# Patient Record
Sex: Female | Born: 1935 | Race: White | Hispanic: No | Smoking: Never smoker
Health system: Southern US, Community
[De-identification: ages and names within clinical notes are randomized; demographics above are authoritative.]

## PROBLEM LIST (undated history)

## (undated) DIAGNOSIS — E785 Hyperlipidemia, unspecified: Secondary | ICD-10-CM

## (undated) DIAGNOSIS — M109 Gout, unspecified: Secondary | ICD-10-CM

## (undated) DIAGNOSIS — N289 Disorder of kidney and ureter, unspecified: Secondary | ICD-10-CM

## (undated) DIAGNOSIS — J449 Chronic obstructive pulmonary disease, unspecified: Secondary | ICD-10-CM

## (undated) DIAGNOSIS — I639 Cerebral infarction, unspecified: Secondary | ICD-10-CM

## (undated) DIAGNOSIS — E119 Type 2 diabetes mellitus without complications: Secondary | ICD-10-CM

## (undated) DIAGNOSIS — I10 Essential (primary) hypertension: Secondary | ICD-10-CM

---

## 2017-12-01 ENCOUNTER — Emergency Department (HOSPITAL_COMMUNITY): Payer: Medicare Other

## 2017-12-01 ENCOUNTER — Emergency Department (HOSPITAL_COMMUNITY)
Admission: EM | Admit: 2017-12-01 | Discharge: 2017-12-01 | Disposition: A | Discharge status: Home / Self Care | Payer: Medicare Other | Attending: Emergency Medicine | Admitting: Emergency Medicine

## 2017-12-01 ENCOUNTER — Other Ambulatory Visit: Payer: Self-pay

## 2017-12-01 ENCOUNTER — Encounter (HOSPITAL_COMMUNITY): Payer: Self-pay | Admitting: *Deleted

## 2017-12-01 DIAGNOSIS — E119 Type 2 diabetes mellitus without complications: Secondary | ICD-10-CM | POA: Insufficient documentation

## 2017-12-01 DIAGNOSIS — J449 Chronic obstructive pulmonary disease, unspecified: Secondary | ICD-10-CM | POA: Diagnosis not present

## 2017-12-01 DIAGNOSIS — Y999 Unspecified external cause status: Secondary | ICD-10-CM | POA: Diagnosis not present

## 2017-12-01 DIAGNOSIS — Y939 Activity, unspecified: Secondary | ICD-10-CM | POA: Insufficient documentation

## 2017-12-01 DIAGNOSIS — Z7982 Long term (current) use of aspirin: Secondary | ICD-10-CM | POA: Diagnosis not present

## 2017-12-01 DIAGNOSIS — Z8673 Personal history of transient ischemic attack (TIA), and cerebral infarction without residual deficits: Secondary | ICD-10-CM | POA: Insufficient documentation

## 2017-12-01 DIAGNOSIS — S0083XA Contusion of other part of head, initial encounter: Secondary | ICD-10-CM

## 2017-12-01 DIAGNOSIS — W19XXXA Unspecified fall, initial encounter: Secondary | ICD-10-CM

## 2017-12-01 DIAGNOSIS — Z7984 Long term (current) use of oral hypoglycemic drugs: Secondary | ICD-10-CM | POA: Diagnosis not present

## 2017-12-01 DIAGNOSIS — T1490XA Injury, unspecified, initial encounter: Secondary | ICD-10-CM

## 2017-12-01 DIAGNOSIS — Y929 Unspecified place or not applicable: Secondary | ICD-10-CM | POA: Diagnosis not present

## 2017-12-01 DIAGNOSIS — S0990XA Unspecified injury of head, initial encounter: Secondary | ICD-10-CM | POA: Diagnosis present

## 2017-12-01 DIAGNOSIS — E785 Hyperlipidemia, unspecified: Secondary | ICD-10-CM | POA: Insufficient documentation

## 2017-12-01 DIAGNOSIS — Z79899 Other long term (current) drug therapy: Secondary | ICD-10-CM | POA: Diagnosis not present

## 2017-12-01 DIAGNOSIS — W07XXXA Fall from chair, initial encounter: Secondary | ICD-10-CM | POA: Diagnosis not present

## 2017-12-01 HISTORY — DX: Type 2 diabetes mellitus without complications: E11.9

## 2017-12-01 HISTORY — DX: Hyperlipidemia, unspecified: E78.5

## 2017-12-01 HISTORY — DX: Chronic obstructive pulmonary disease, unspecified: J44.9

## 2017-12-01 HISTORY — DX: Gout, unspecified: M10.9

## 2017-12-01 HISTORY — DX: Cerebral infarction, unspecified: I63.9

## 2017-12-01 HISTORY — DX: Essential (primary) hypertension: I10

## 2017-12-01 HISTORY — DX: Disorder of kidney and ureter, unspecified: N28.9

## 2017-12-01 MED ORDER — BACITRACIN ZINC 500 UNIT/GM EX OINT: 1.0000 "application " | TOPICAL_OINTMENT | Freq: Two times a day (BID) | CUTANEOUS | 0 refills | Status: DC

## 2017-12-01 MED ORDER — BACITRACIN ZINC 500 UNIT/GM EX OINT
1.0000 "application " | TOPICAL_OINTMENT | Freq: Two times a day (BID) | CUTANEOUS | Status: DC
  Administered 2017-12-01: 1 via TOPICAL
  Filled 2017-12-01: qty 0.9

## 2017-12-01 NOTE — ED Provider Notes (Signed)
[facility] EMERGENCY DEPARTMENT Provider Note   CSN: [id number] Arrival date & time: 12/01/17  1738     History   Chief Complaint Chief Complaint  Patient presents with  . Fall    HPI Judith Turner is a 84 y.o. female.  HPI  The patient is a 84 year old female, she has a known history of COPD, diabetes, stroke and is on chronic aspirin therapy.  She had a fall which was mechanical as she lost her balance trying to sit down in a chair falling forward and striking her face against the ground.  This happened a short time ago just prior to arrival, the symptoms are persistent, gradually improving and at this time she has no pain though she does report some swelling of her forehead, nose and some bruising under her bilateral eyes.  She also has some pain in her right small finger.  She did not attempt to get up off the ground, she did not lose consciousness, she has not had any vomiting or seizure activity.  Past Medical History:  Diagnosis Date  . COPD (chronic obstructive pulmonary disease) (HCC)   . Diabetes mellitus without complication (HCC)   . Gout   . Hyperlipidemia   . Hypertension   . Renal disorder    Stage 1 renal failure  . Stroke [facility])    TIA no residual  deficits    Patient Active Problem List   Diagnosis Date Noted  . Hyperlipidemia     History reviewed. No pertinent surgical history.  OB History    No data available       Home Medications    Prior to Admission medications   Medication Sig Start Date End Date Taking? Authorizing Provider  acetaminophen (TYLENOL) 325 MG tablet Take 650 mg by mouth every 4 (four) hours as needed.   Yes [provider]  albuterol (PROVENTIL HFA;VENTOLIN HFA) 108 (90 Base) MCG/ACT inhaler Inhale into the lungs 4 (four) times daily as needed for wheezing or shortness of breath.   Yes [provider]  allopurinol (ZYLOPRIM) 100 MG tablet Take 100 mg by mouth daily.   Yes [provider]  amLODipine (NORVASC) 5 MG tablet Take 5 mg by mouth daily.   Yes [provider]  aspirin 325 MG tablet Take 325 mg by mouth daily.   Yes [provider]  cholecalciferol (VITAMIN D) 1000 units tablet Take 1,000 Units by mouth daily.   Yes [provider]  cyanocobalamin 500 MCG tablet Take 500 mcg by mouth daily.   Yes [provider]  ferrous sulfate 325 (65 FE) MG tablet Take 325 mg by mouth daily with breakfast.   Yes [provider]  magnesium oxide (MAG-OX) 400 MG tablet Take 400 mg by mouth 2 (two) times daily.   Yes [provider]  metFORMIN (GLUCOPHAGE) 500 MG tablet Take 500 mg by mouth 3 (three) times daily.   Yes [provider]  metoprolol tartrate (LOPRESSOR) 25 MG tablet Take 25 mg by mouth 2 (two) times daily.   Yes [provider]  Probiotic Product (RISA-BID PROBIOTIC) TABS Take 1 tablet by mouth daily.   Yes [provider]  senna (SENOKOT) 8.6 MG TABS tablet Take 1 tablet by mouth daily as needed for mild constipation.   Yes [provider]  tolnaftate (TINACTIN) 1 % spray Apply 1 application topically 2 (two) times daily.   Yes [provider]  bacitracin ointment Apply 1 application topically 2 (  two) times daily. 12/01/17   Eber HongMiller, Laray Rivkin, MD    Family History History reviewed. No pertinent family history.  Social History Social History   Tobacco Use  . Smoking status: Never Smoker  . Smokeless tobacco: Current User  Substance Use Topics  . Alcohol use: No    Frequency: Never  . Drug use: No     Allergies   Patient has no known allergies.   Review of Systems Review of Systems  All other systems reviewed and are negative.    Physical Exam Updated Vital Signs BP (!) 145/73   Pulse 83   Temp 97.8 F (36.6 C) (Oral)   Resp 17   Ht 5\' 1"  (1.549 m)   Wt 73.9 kg (163 lb)   SpO2 97%   BMI 30.80 kg/m   Physical Exam  Constitutional: She  appears well-developed and well-nourished. No distress.  HENT:  Head: Normocephalic.  Mouth/Throat: Oropharynx is clear and moist. No oropharyngeal exudate.  Bilateral raccoon eyes present, tenderness over the nasal bridge, abrasion over the nasal bridge as well.  Hematoma over the forehead.  Eyes: Conjunctivae and EOM are normal. Pupils are equal, round, and reactive to light. Right eye exhibits no discharge. Left eye exhibits no discharge. No scleral icterus.  Neck: Normal range of motion. Neck supple. No JVD present. No thyromegaly present.  Cardiovascular: Normal rate, regular rhythm, normal heart sounds and intact distal pulses. Exam reveals no gallop and no friction rub.  No murmur heard. Pulmonary/Chest: Effort normal and breath sounds normal. No respiratory distress. She has no wheezes. She has no rales.  Abdominal: Soft. Bowel sounds are normal. She exhibits no distension and no mass. There is no tenderness.  Musculoskeletal: Normal range of motion. She exhibits tenderness. She exhibits no edema.  Mild tenderness over the right small finger at the interphalangeal distal joint, no other tenderness deformity or significant abnormality.  Joints are all supple, compartments are soft,  Lymphadenopathy:    She has no cervical adenopathy.  Neurological: She is alert. Coordination normal.  Mental status is awake alert, able to follow commands without any difficulties.  Skin: Skin is warm and dry. No rash noted. No erythema.  Psychiatric: She has a normal mood and affect. Her behavior is normal.  Nursing note and vitals reviewed.    ED Treatments / Results  Labs (all labs ordered are listed, but only abnormal results are displayed) Labs Reviewed - No data to display  EKG  EKG Interpretation  Date/Time:  Thursday December 01 2017 17:50:39 EST Ventricular Rate:  80 PR Interval:    QRS Duration: 92 QT Interval:  403 QTC Calculation: 465 R Axis:   24 Text Interpretation:  Sinus  rhythm Anterior infarct, old Minimal ST elevation, inferior leads q Waves in oleads V1-V3 No old tracing to compare Confirmed by Eber HongMiller, Lakhia Gengler ([id number]) on 12/01/2017 5:52:54 PM       Radiology Ct Head Wo Contrast  Result Date: 12/01/2017 CLINICAL DATA:  Fall.  Facial trauma. EXAM: CT HEAD WITHOUT CONTRAST CT MAXILLOFACIAL WITHOUT CONTRAST CT CERVICAL SPINE WITHOUT CONTRAST TECHNIQUE: Multidetector CT imaging of the head, cervical spine, and maxillofacial structures were performed using the standard protocol without intravenous contrast. Multiplanar CT image reconstructions of the cervical spine and maxillofacial structures were also generated. COMPARISON:  None. FINDINGS: CT HEAD FINDINGS Brain: No evidence of acute infarction, hemorrhage, hydrocephalus, extra-axial collection or mass lesion/mass effect. Mild atrophy. Moderate chronic microvascular ischemic changes. Vascular: Intracranial atherosclerosis.  No hyperdense vessel. Skull: Negative  for fracture or focal lesion. Other: Small frontal scalp hematoma. CT MAXILLOFACIAL FINDINGS Osseous: No fracture or mandibular dislocation. No destructive process. Orbits: Negative. No traumatic or inflammatory finding. Sinuses: Clear. Soft tissues: Small hematoma over the nasal bridge. CT CERVICAL SPINE FINDINGS Alignment: Focal reversal of the normal cervical lordosis at C3-C4. No traumatic malalignment. Skull base and vertebrae: No acute fracture. No primary bone lesion or focal pathologic process. Soft tissues and spinal canal: No prevertebral fluid or swelling. No visible canal hematoma. Disc levels: Severe degenerative disc disease and uncovertebral hypertrophy from C3-C4 through C5-C6. Upper chest: Negative. Other: Atherosclerotic calcifications of the bilateral carotid bifurcations. IMPRESSION: 1. No acute intracranial abnormality. Small frontal scalp hematoma. 2. No acute facial fracture.  Small hematoma over the nasal bridge. 3. No acute cervical spine  fracture. Severe degenerative changes from C3-C4 through C5-C6. Electronically Signed   By: Obie Dredge M.D.   On: 12/01/2017 18:43   Ct Cervical Spine Wo Contrast  Result Date: 12/01/2017 CLINICAL DATA:  Fall.  Facial trauma. EXAM: CT HEAD WITHOUT CONTRAST CT MAXILLOFACIAL WITHOUT CONTRAST CT CERVICAL SPINE WITHOUT CONTRAST TECHNIQUE: Multidetector CT imaging of the head, cervical spine, and maxillofacial structures were performed using the standard protocol without intravenous contrast. Multiplanar CT image reconstructions of the cervical spine and maxillofacial structures were also generated. COMPARISON:  None. FINDINGS: CT HEAD FINDINGS Brain: No evidence of acute infarction, hemorrhage, hydrocephalus, extra-axial collection or mass lesion/mass effect. Mild atrophy. Moderate chronic microvascular ischemic changes. Vascular: Intracranial atherosclerosis.  No hyperdense vessel. Skull: Negative for fracture or focal lesion. Other: Small frontal scalp hematoma. CT MAXILLOFACIAL FINDINGS Osseous: No fracture or mandibular dislocation. No destructive process. Orbits: Negative. No traumatic or inflammatory finding. Sinuses: Clear. Soft tissues: Small hematoma over the nasal bridge. CT CERVICAL SPINE FINDINGS Alignment: Focal reversal of the normal cervical lordosis at C3-C4. No traumatic malalignment. Skull base and vertebrae: No acute fracture. No primary bone lesion or focal pathologic process. Soft tissues and spinal canal: No prevertebral fluid or swelling. No visible canal hematoma. Disc levels: Severe degenerative disc disease and uncovertebral hypertrophy from C3-C4 through C5-C6. Upper chest: Negative. Other: Atherosclerotic calcifications of the bilateral carotid bifurcations. IMPRESSION: 1. No acute intracranial abnormality. Small frontal scalp hematoma. 2. No acute facial fracture.  Small hematoma over the nasal bridge. 3. No acute cervical spine fracture. Severe degenerative changes from C3-C4  through C5-C6. Electronically Signed   By: Obie Dredge M.D.   On: 12/01/2017 18:43   Dg Hand Complete Right  Result Date: 12/01/2017 CLINICAL DATA:  Right hand pain and swelling after fall. EXAM: RIGHT HAND - COMPLETE 3+ VIEW COMPARISON:  None. FINDINGS: No acute fracture or malalignment. Joint spaces are preserved. Osteopenia. Vascular calcifications. Soft tissues are otherwise unremarkable. IMPRESSION: No acute osseous abnormality. Electronically Signed   By: Obie Dredge M.D.   On: 12/01/2017 19:05   Ct Maxillofacial Wo Contrast  Result Date: 12/01/2017 CLINICAL DATA:  Fall.  Facial trauma. EXAM: CT HEAD WITHOUT CONTRAST CT MAXILLOFACIAL WITHOUT CONTRAST CT CERVICAL SPINE WITHOUT CONTRAST TECHNIQUE: Multidetector CT imaging of the head, cervical spine, and maxillofacial structures were performed using the standard protocol without intravenous contrast. Multiplanar CT image reconstructions of the cervical spine and maxillofacial structures were also generated. COMPARISON:  None. FINDINGS: CT HEAD FINDINGS Brain: No evidence of acute infarction, hemorrhage, hydrocephalus, extra-axial collection or mass lesion/mass effect. Mild atrophy. Moderate chronic microvascular ischemic changes. Vascular: Intracranial atherosclerosis.  No hyperdense vessel. Skull: Negative for fracture or focal lesion. Other: Small frontal  scalp hematoma. CT MAXILLOFACIAL FINDINGS Osseous: No fracture or mandibular dislocation. No destructive process. Orbits: Negative. No traumatic or inflammatory finding. Sinuses: Clear. Soft tissues: Small hematoma over the nasal bridge. CT CERVICAL SPINE FINDINGS Alignment: Focal reversal of the normal cervical lordosis at C3-C4. No traumatic malalignment. Skull base and vertebrae: No acute fracture. No primary bone lesion or focal pathologic process. Soft tissues and spinal canal: No prevertebral fluid or swelling. No visible canal hematoma. Disc levels: Severe degenerative disc disease and  uncovertebral hypertrophy from C3-C4 through C5-C6. Upper chest: Negative. Other: Atherosclerotic calcifications of the bilateral carotid bifurcations. IMPRESSION: 1. No acute intracranial abnormality. Small frontal scalp hematoma. 2. No acute facial fracture.  Small hematoma over the nasal bridge. 3. No acute cervical spine fracture. Severe degenerative changes from C3-C4 through C5-C6. Electronically Signed   By: Obie Dredge M.D.   On: 12/01/2017 18:43    Procedures Procedures (including critical care time)  Medications Ordered in ED Medications  bacitracin ointment 1 application (not administered)     Initial Impression / Assessment and Plan / ED Course  I have reviewed the triage vital signs and the nursing notes.  Pertinent labs & imaging results that were available during my care of the patient were reviewed by me and considered in my medical decision making (see chart for details).    The patient has some evidence of a basilar skull fracture with raccoon eyes though this could just be bruising secondary to the forehead hematoma and being on antiplatelet agent.  CT scan of the head maxillofacial bones and cervical spine are pending.  This was not a metabolic or other type of fall, this was not syncope, this was purely mechanical.  She is in good spirits and at her baseline mental status, daughter is here and vouches for the same  Itching negative, patient otherwise stable appearing for discharge, local wound care, ice packs, dressing  Final Clinical Impressions(s) / ED Diagnoses   Final diagnoses:  Contusion of forehead, initial encounter  Fall, initial encounter    ED Discharge Orders        Ordered    bacitracin ointment  2 times daily     12/01/17 2003       Eber Hong, MD 12/01/17 2005

## 2017-12-01 NOTE — ED Triage Notes (Signed)
PER EMS  Pt fell and hit forehead on floor.  Pt reported she heard bones cracking bones. Pt arrived with swelling to forehead and tissue surrounding eyes swollen and bruised. Pt also has a lac to bridge of nose. Pt BP 210-78, HR 74, 97% RA, CBG 153, Pt denies any neck or back pain.. Pt does take one ASA  Each day.

## 2017-12-01 NOTE — Discharge Instructions (Signed)
Your x-rays reveal no signs of broken bones, brain injury, fractured spine or facial bones.  Please keep antibiotic ointment on your wound twice a day, you may shower but keep the wounds clean and dry when not in the shower.  If you should develop increasing pain swelling fever please return to the emergency department.  You may have some oozing and bleeding from that wound for the next couple of days.

## 2018-06-26 ENCOUNTER — Inpatient Hospital Stay (HOSPITAL_COMMUNITY): Payer: Medicare Other

## 2018-06-26 ENCOUNTER — Emergency Department (HOSPITAL_COMMUNITY): Payer: Medicare Other

## 2018-06-26 ENCOUNTER — Inpatient Hospital Stay (HOSPITAL_COMMUNITY)
Admission: EM | Admit: 2018-06-26 | Discharge: 2018-06-29 | DRG: 071 | Disposition: A | Discharge status: Skilled Nursing Facility | Payer: Medicare Other | Source: Skilled Nursing Facility | Attending: Family Medicine | Admitting: Family Medicine

## 2018-06-26 ENCOUNTER — Encounter (HOSPITAL_COMMUNITY): Payer: Self-pay | Admitting: Emergency Medicine

## 2018-06-26 DIAGNOSIS — Z7984 Long term (current) use of oral hypoglycemic drugs: Secondary | ICD-10-CM | POA: Diagnosis not present

## 2018-06-26 DIAGNOSIS — Z7982 Long term (current) use of aspirin: Secondary | ICD-10-CM

## 2018-06-26 DIAGNOSIS — I639 Cerebral infarction, unspecified: Secondary | ICD-10-CM | POA: Diagnosis present

## 2018-06-26 DIAGNOSIS — Z8673 Personal history of transient ischemic attack (TIA), and cerebral infarction without residual deficits: Secondary | ICD-10-CM | POA: Diagnosis not present

## 2018-06-26 DIAGNOSIS — G9341 Metabolic encephalopathy: Principal | ICD-10-CM | POA: Diagnosis present

## 2018-06-26 DIAGNOSIS — R9401 Abnormal electroencephalogram [EEG]: Secondary | ICD-10-CM | POA: Diagnosis present

## 2018-06-26 DIAGNOSIS — E785 Hyperlipidemia, unspecified: Secondary | ICD-10-CM | POA: Diagnosis present

## 2018-06-26 DIAGNOSIS — J449 Chronic obstructive pulmonary disease, unspecified: Secondary | ICD-10-CM | POA: Diagnosis present

## 2018-06-26 DIAGNOSIS — I10 Essential (primary) hypertension: Secondary | ICD-10-CM | POA: Diagnosis present

## 2018-06-26 DIAGNOSIS — N390 Urinary tract infection, site not specified: Secondary | ICD-10-CM | POA: Diagnosis present

## 2018-06-26 DIAGNOSIS — I160 Hypertensive urgency: Secondary | ICD-10-CM | POA: Diagnosis present

## 2018-06-26 DIAGNOSIS — L98499 Non-pressure chronic ulcer of skin of other sites with unspecified severity: Secondary | ICD-10-CM | POA: Diagnosis not present

## 2018-06-26 DIAGNOSIS — F17290 Nicotine dependence, other tobacco product, uncomplicated: Secondary | ICD-10-CM | POA: Diagnosis present

## 2018-06-26 DIAGNOSIS — N3000 Acute cystitis without hematuria: Secondary | ICD-10-CM | POA: Diagnosis not present

## 2018-06-26 DIAGNOSIS — M109 Gout, unspecified: Secondary | ICD-10-CM | POA: Diagnosis present

## 2018-06-26 DIAGNOSIS — E119 Type 2 diabetes mellitus without complications: Secondary | ICD-10-CM | POA: Diagnosis present

## 2018-06-26 DIAGNOSIS — L97929 Non-pressure chronic ulcer of unspecified part of left lower leg with unspecified severity: Secondary | ICD-10-CM | POA: Diagnosis present

## 2018-06-26 DIAGNOSIS — D696 Thrombocytopenia, unspecified: Secondary | ICD-10-CM | POA: Diagnosis present

## 2018-06-26 DIAGNOSIS — R4182 Altered mental status, unspecified: Secondary | ICD-10-CM

## 2018-06-26 DIAGNOSIS — D539 Nutritional anemia, unspecified: Secondary | ICD-10-CM | POA: Diagnosis present

## 2018-06-26 DIAGNOSIS — N179 Acute kidney failure, unspecified: Secondary | ICD-10-CM | POA: Diagnosis present

## 2018-06-26 DIAGNOSIS — Z87440 Personal history of urinary (tract) infections: Secondary | ICD-10-CM | POA: Diagnosis not present

## 2018-06-26 LAB — LIPID PANEL
CHOL/HDL RATIO: 4.7 ratio
CHOLESTEROL: 191 mg/dL (ref 0–200)
HDL: 41 mg/dL (ref >40)
LDL Cholesterol: 106 mg/dL — ABNORMAL HIGH (ref 0–99)
Triglycerides: 219 mg/dL — ABNORMAL HIGH (ref <150)
VLDL: 44 mg/dL — ABNORMAL HIGH (ref 0–40)

## 2018-06-26 LAB — GLUCOSE, CAPILLARY
GLUCOSE-CAPILLARY: 152 mg/dL — AB (ref 70–99)
GLUCOSE-CAPILLARY: 192 mg/dL — AB (ref 70–99)
Glucose-Capillary: 125 mg/dL — ABNORMAL HIGH (ref 70–99)
Glucose-Capillary: 131 mg/dL — ABNORMAL HIGH (ref 70–99)
Glucose-Capillary: 131 mg/dL — ABNORMAL HIGH (ref 70–99)

## 2018-06-26 LAB — URINALYSIS, ROUTINE W REFLEX MICROSCOPIC
Bilirubin Urine: NEGATIVE
GLUCOSE, UA: NEGATIVE mg/dL
Ketones, ur: NEGATIVE mg/dL
NITRITE: POSITIVE — AB
PROTEIN: 30 mg/dL — AB
SPECIFIC GRAVITY, URINE: 1.013 (ref 1.005–1.030)
pH: 5 (ref 5.0–8.0)

## 2018-06-26 LAB — DIFFERENTIAL
ABS IMMATURE GRANULOCYTES: 0 10*3/uL (ref 0.0–0.1)
BASOS PCT: 0 %
Basophils Absolute: 0 10*3/uL (ref 0.0–0.1)
EOS ABS: 0.4 10*3/uL (ref 0.0–0.7)
EOS PCT: 5 %
IMMATURE GRANULOCYTES: 0 %
LYMPHS ABS: 2.9 10*3/uL (ref 0.7–4.0)
Lymphocytes Relative: 32 %
MONOS PCT: 6 %
Monocytes Absolute: 0.6 10*3/uL (ref 0.1–1.0)
NEUTROS PCT: 57 %
Neutro Abs: 5.1 10*3/uL (ref 1.7–7.7)

## 2018-06-26 LAB — CBC
HCT: 39.4 % (ref 36.0–46.0)
Hemoglobin: 11.8 g/dL — ABNORMAL LOW (ref 12.0–15.0)
MCH: 32.3 pg (ref 26.0–34.0)
MCHC: 29.9 g/dL — ABNORMAL LOW (ref 30.0–36.0)
MCV: 107.9 fL — AB (ref 78.0–100.0)
PLATELETS: 120 10*3/uL — AB (ref 150–400)
RBC: 3.65 MIL/uL — AB (ref 3.87–5.11)
RDW: 14 % (ref 11.5–15.5)
WBC: 9 10*3/uL (ref 4.0–10.5)

## 2018-06-26 LAB — CBG MONITORING, ED: GLUCOSE-CAPILLARY: 146 mg/dL — AB (ref 70–99)

## 2018-06-26 LAB — COMPREHENSIVE METABOLIC PANEL
ALT: 9 U/L (ref 0–44)
ANION GAP: 12 (ref 5–15)
AST: 14 U/L — ABNORMAL LOW (ref 15–41)
Albumin: 3.2 g/dL — ABNORMAL LOW (ref 3.5–5.0)
Alkaline Phosphatase: 58 U/L (ref 38–126)
BILIRUBIN TOTAL: 0.7 mg/dL (ref 0.3–1.2)
BUN: 37 mg/dL — ABNORMAL HIGH (ref 8–23)
CO2: 27 mmol/L (ref 22–32)
Calcium: 8.9 mg/dL (ref 8.9–10.3)
Chloride: 100 mmol/L (ref 98–111)
Creatinine, Ser: 1.74 mg/dL — ABNORMAL HIGH (ref 0.44–1.00)
GFR calc Af Amer: 28 mL/min — ABNORMAL LOW (ref >60)
GFR, EST NON AFRICAN AMERICAN: 24 mL/min — AB (ref >60)
Glucose, Bld: 150 mg/dL — ABNORMAL HIGH (ref 70–99)
POTASSIUM: 4.8 mmol/L (ref 3.5–5.1)
Sodium: 139 mmol/L (ref 135–145)
TOTAL PROTEIN: 6.1 g/dL — AB (ref 6.5–8.1)

## 2018-06-26 LAB — IRON AND TIBC
IRON: 77 ug/dL (ref 28–170)
SATURATION RATIOS: 26 % (ref 10.4–31.8)
TIBC: 294 ug/dL (ref 250–450)
UIBC: 217 ug/dL

## 2018-06-26 LAB — I-STAT CHEM 8, ED
BUN: 37 mg/dL — AB (ref 8–23)
CALCIUM ION: 1.09 mmol/L — AB (ref 1.15–1.40)
Chloride: 101 mmol/L (ref 98–111)
Creatinine, Ser: 1.7 mg/dL — ABNORMAL HIGH (ref 0.44–1.00)
Glucose, Bld: 147 mg/dL — ABNORMAL HIGH (ref 70–99)
HEMATOCRIT: 37 % (ref 36.0–46.0)
HEMOGLOBIN: 12.6 g/dL (ref 12.0–15.0)
Potassium: 4.7 mmol/L (ref 3.5–5.1)
SODIUM: 137 mmol/L (ref 135–145)
TCO2: 26 mmol/L (ref 22–32)

## 2018-06-26 LAB — FOLATE: Folate: 7.4 ng/mL (ref >5.9)

## 2018-06-26 LAB — AMMONIA: Ammonia: 15 umol/L (ref 9–35)

## 2018-06-26 LAB — I-STAT TROPONIN, ED: TROPONIN I, POC: 0.04 ng/mL (ref 0.00–0.08)

## 2018-06-26 LAB — RETICULOCYTES
RBC.: 3.82 MIL/uL — ABNORMAL LOW (ref 3.87–5.11)
Retic Count, Absolute: 95.5 10*3/uL (ref 19.0–186.0)
Retic Ct Pct: 2.5 % (ref 0.4–3.1)

## 2018-06-26 LAB — FERRITIN: Ferritin: 68 ng/mL (ref 11–307)

## 2018-06-26 LAB — PROTIME-INR
INR: 0.96
PROTHROMBIN TIME: 12.7 s (ref 11.4–15.2)

## 2018-06-26 LAB — TSH: TSH: 2.142 u[IU]/mL (ref 0.350–4.500)

## 2018-06-26 LAB — VITAMIN B12: Vitamin B-12: 1342 pg/mL — ABNORMAL HIGH (ref 180–914)

## 2018-06-26 LAB — APTT: aPTT: 24 seconds (ref 24–36)

## 2018-06-26 LAB — MRSA PCR SCREENING: MRSA by PCR: NEGATIVE

## 2018-06-26 MED ORDER — ASPIRIN 300 MG RE SUPP
300.0000 mg | Freq: Every day | RECTAL | Status: DC
  Administered 2018-06-26 – 2018-06-27 (×2): 300 mg via RECTAL
  Filled 2018-06-26 (×2): qty 1

## 2018-06-26 MED ORDER — LORAZEPAM 2 MG/ML IJ SOLN: 1.0000 mg | INTRAMUSCULAR | Status: DC | PRN

## 2018-06-26 MED ORDER — ONDANSETRON HCL 4 MG/2ML IJ SOLN: 4.0000 mg | Freq: Three times a day (TID) | INTRAMUSCULAR | Status: DC | PRN

## 2018-06-26 MED ORDER — SODIUM CHLORIDE 0.9 % IV SOLN
INTRAVENOUS | Status: DC
  Administered 2018-06-26: 12:00:00 via INTRAVENOUS

## 2018-06-26 MED ORDER — NITROGLYCERIN 2 % TD OINT: 0.5000 [in_us] | TOPICAL_OINTMENT | Freq: Once | TRANSDERMAL | Status: DC

## 2018-06-26 MED ORDER — SODIUM CHLORIDE 0.9 % IV SOLN: 1.0000 g | INTRAVENOUS | Status: DC

## 2018-06-26 MED ORDER — HEPARIN SODIUM (PORCINE) 5000 UNIT/ML IJ SOLN
5000.0000 [IU] | Freq: Three times a day (TID) | INTRAMUSCULAR | Status: DC
  Administered 2018-06-26 – 2018-06-29 (×10): 5000 [IU] via SUBCUTANEOUS
  Filled 2018-06-26 (×11): qty 1

## 2018-06-26 MED ORDER — ALBUTEROL SULFATE (2.5 MG/3ML) 0.083% IN NEBU: 2.5000 mg | INHALATION_SOLUTION | RESPIRATORY_TRACT | Status: DC | PRN

## 2018-06-26 MED ORDER — NALOXONE HCL 2 MG/2ML IJ SOSY
1.0000 mg | PREFILLED_SYRINGE | Freq: Once | INTRAMUSCULAR | Status: AC
Start: 2018-06-26 — End: 2018-06-26
  Administered 2018-06-26: 1 mg via INTRAVENOUS

## 2018-06-26 MED ORDER — SODIUM CHLORIDE 0.9 % IV SOLN
INTRAVENOUS | Status: AC
  Administered 2018-06-26 – 2018-06-27 (×2): via INTRAVENOUS

## 2018-06-26 MED ORDER — HYDRALAZINE HCL 20 MG/ML IJ SOLN
5.0000 mg | INTRAMUSCULAR | Status: DC | PRN
  Administered 2018-06-26: 5 mg via INTRAVENOUS
  Filled 2018-06-26: qty 1

## 2018-06-26 MED ORDER — ACETAMINOPHEN 650 MG RE SUPP: 650.0000 mg | Freq: Four times a day (QID) | RECTAL | Status: DC | PRN

## 2018-06-26 MED ORDER — ZINC OXIDE 40 % EX OINT
TOPICAL_OINTMENT | Freq: Two times a day (BID) | CUTANEOUS | Status: DC
  Administered 2018-06-26: 12:00:00 via TOPICAL
  Administered 2018-06-26: 1 via TOPICAL
  Administered 2018-06-27 – 2018-06-28 (×3): via TOPICAL
  Administered 2018-06-28 – 2018-06-29 (×2): 1 via TOPICAL
  Filled 2018-06-26: qty 57

## 2018-06-26 MED ORDER — SODIUM CHLORIDE 0.9 % IV SOLN
1.0000 g | INTRAVENOUS | Status: DC
  Administered 2018-06-26 – 2018-06-27 (×2): 1 g via INTRAVENOUS
  Filled 2018-06-26 (×3): qty 10

## 2018-06-26 MED ORDER — INSULIN ASPART 100 UNIT/ML ~~LOC~~ SOLN
0.0000 [IU] | Freq: Three times a day (TID) | SUBCUTANEOUS | Status: DC
  Administered 2018-06-26: 1 [IU] via SUBCUTANEOUS

## 2018-06-26 MED ORDER — HYDRALAZINE HCL 20 MG/ML IJ SOLN
5.0000 mg | INTRAMUSCULAR | Status: DC | PRN
  Administered 2018-06-26 – 2018-06-29 (×4): 5 mg via INTRAVENOUS
  Filled 2018-06-26 (×5): qty 1

## 2018-06-26 MED ORDER — SODIUM CHLORIDE 0.9 % IV SOLN: 1.0000 g | Freq: Once | INTRAVENOUS | Status: DC

## 2018-06-26 MED ORDER — STROKE: EARLY STAGES OF RECOVERY BOOK
Freq: Once | Status: AC
  Administered 2018-06-26: 06:00:00
  Filled 2018-06-26: qty 1

## 2018-06-26 MED ORDER — HYDRALAZINE HCL 20 MG/ML IJ SOLN: 2.0000 mg | Freq: Once | INTRAMUSCULAR | Status: DC

## 2018-06-26 MED ORDER — INSULIN ASPART 100 UNIT/ML ~~LOC~~ SOLN
0.0000 [IU] | SUBCUTANEOUS | Status: DC
  Administered 2018-06-26: 2 [IU] via SUBCUTANEOUS
  Administered 2018-06-26 – 2018-06-29 (×6): 1 [IU] via SUBCUTANEOUS

## 2018-06-26 MED ORDER — LEVETIRACETAM IN NACL 1000 MG/100ML IV SOLN
1000.0000 mg | Freq: Once | INTRAVENOUS | Status: AC
  Administered 2018-06-26: 1000 mg via INTRAVENOUS
  Filled 2018-06-26: qty 100

## 2018-06-26 MED ORDER — INSULIN ASPART 100 UNIT/ML ~~LOC~~ SOLN: 0.0000 [IU] | Freq: Every day | SUBCUTANEOUS | Status: DC

## 2018-06-26 NOTE — Evaluation (Signed)
Occupational Therapy Evaluation Patient Details Name: Judith Turner MRN: [id number] DOB: 01/07/1923 Today's Date: 06/26/2018    History of Present Illness 84 y.o. female with medical history significant of hypertension, hyperlipidemia, diabetes mellitus, COPD, stroke, gout, who presents with altered mental status   Clinical Impression   Patient presenting with decreased independence in all aspects of care and functional mobility, strength, balance, attention, cognition, strength, activity tolerance.  Patient came from SNF with assistance needed for self care but ambulating/transfer with RW per her daughter. Patient currently functioning at total +2. Patient will benefit from acute OT to increase overall independence in the areas of ADLs, functional mobility,and strengthening in order to safely discharge to next venue of care.Pt with tremors on L side of body and BP in supine 201/107. RN notified.    Follow Up Recommendations  Supervision/Assistance - 24 hour;SNF    Equipment Recommendations  Other (comment)(defer to next venue of care)    Recommendations for Other Services Other (comment)(none at this time)     Precautions / Restrictions Precautions Precautions: Fall      Mobility Bed Mobility Overal bed mobility: Needs Assistance Bed Mobility: Supine to Sit;Sit to Supine     Supine to sit: +2 for physical assistance;Total assist Sit to supine: +2 for physical assistance;Total assist   General bed mobility comments: Patient required total assist via helicopter technique to come to EOB and return to supine  Transfers Overall transfer level: Needs assistance   Transfers: Sit to/from Stand Sit to Stand: Max assist;+2 physical assistance         General transfer comment: patient required assist to elevate trunk and buttocks from bed surface, 2 pherson assit bilaterally. Patient was able to take weight on LEs but unable to maintain upright posture or positioning without  physical assist    Balance Overall balance assessment: Needs assistance Sitting-balance support: Feet supported Sitting balance-Leahy Scale: Poor Sitting balance - Comments: patient with significant right sided lean, unable to correct or maintain midline Postural control: Right lateral lean       ADL either performed or assessed with clinical judgement   ADL Overall ADL's : Needs assistance/impaired      General ADL Comments: +2 self care from bed level for safety. Hand over hand to wash face and intiated face washing x1 this session on her own.                  Pertinent Vitals/Pain Pain Assessment: Faces Faces Pain Scale: No hurt     Hand Dominance Right   Extremity/Trunk Assessment Upper Extremity Assessment Upper Extremity Assessment: Difficult to assess due to impaired cognition   Lower Extremity Assessment Lower Extremity Assessment: Difficult to assess due to impaired cognition   Cervical / Trunk Assessment Cervical / Trunk Assessment: Kyphotic   Communication Communication Communication: Expressive difficulties   Cognition Arousal/Alertness: Lethargic Behavior During Therapy: Flat affect Overall Cognitive Status: Difficult to assess    General Comments: lethargy improved once seated on EOB              Home Living Family/patient expects to be discharged to:: Skilled nursing facility           Prior Functioning/Environment Level of Independence: Needs assistance  Gait / Transfers Assistance Needed: patient required assist with RW for mobility ADL's / Homemaking Assistance Needed: dependent for ADLs    Comments: information provided at bedside by daughter        OT Problem List: Decreased strength;Impaired balance (sitting and/or standing);Decreased cognition;Pain;Impaired  tone;Decreased range of motion;Decreased safety awareness;Decreased activity tolerance;Decreased coordination;Decreased knowledge of use of DME or AE;Impaired UE  functional use;Impaired sensation      OT Treatment/Interventions: Self-care/ADL training;Manual therapy;Therapeutic exercise;Patient/family education;Neuromuscular education;Balance training;Energy conservation;Therapeutic activities;DME and/or AE instruction;Cognitive remediation/compensation    OT Goals(Current goals can be found in the care plan section) Acute Rehab OT Goals Patient Stated Goal: none stated ADL Goals Pt Will Perform Grooming: with min assist Pt Will Transfer to Toilet: with mod assist Pt Will Perform Toileting - Clothing Manipulation and hygiene: with mod assist  OT Frequency: Min 2X/week   Barriers to D/C:    none known at this time       Co-evaluation   Reason for Co-Treatment: Complexity of the patient's impairments (multi-system involvement);For patient/therapist safety;To address functional/ADL transfers;Necessary to address cognition/behavior during functional activity PT goals addressed during session: Mobility/safety with mobility OT goals addressed during session: ADL's and self-care      AM-PAC PT "6 Clicks" Daily Activity     Outcome Measure Help from another person eating meals?: Total Help from another person taking care of personal grooming?: Total Help from another person toileting, which includes using toliet, bedpan, or urinal?: Total Help from another person bathing (including washing, rinsing, drying)?: Total Help from another person to put on and taking off regular upper body clothing?: Total Help from another person to put on and taking off regular lower body clothing?: Total 6 Click Score: 6   End of Session Nurse Communication: Mobility status;Precautions  Activity Tolerance: Patient limited by fatigue;Patient limited by lethargy Patient left: in bed;with call bell/phone within reach;with bed alarm set;with nursing/sitter in room;with family/visitor present  OT Visit Diagnosis: Muscle weakness (generalized) (M62.81);Unsteadiness on  feet (R26.81);Cognitive communication deficit (R41.841);Hemiplegia and hemiparesis                Time: [id number] OT Time Calculation (min): 22 min Charges:  OT General Charges $OT Visit: 1 Visit OT Evaluation $OT Eval Moderate Complexity: 1 Mod  Logan Baltimore P, MS, OTR/L 06/26/2018, 11:09 AM

## 2018-06-26 NOTE — ED Provider Notes (Signed)
[facility] EMERGENCY DEPARTMENT Provider Note   CSN: [id number] Arrival date & time: 06/26/18  0122     History   Chief Complaint No chief complaint on file.   HPI Judith Turner is a 84 y.o. female.  The history is provided by the EMS personnel. The history is limited by the condition of the patient.  Altered Mental Status   This is a new problem. The current episode started 12 to 24 hours ago. The problem has not changed since onset.Associated symptoms include unresponsiveness. Pertinent negatives include no agitation. Associated symptoms comments: Right gaze preference . Risk factors: h/o cva tia. Her past medical history is significant for CVA and TIA. Her past medical history does not include psychotropic medication treatment.    Past Medical History:  Diagnosis Date  . COPD (chronic obstructive pulmonary disease) (HCC)   . Diabetes mellitus without complication (HCC)   . Gout   . Hyperlipidemia   . Hypertension   . Renal disorder    Stage 1 renal failure  . Stroke [facility])    TIA no residual  deficits    Patient Active Problem List   Diagnosis Date Noted  . Hyperlipidemia     History reviewed. No pertinent surgical history.   OB History   None      Home Medications    Prior to Admission medications   Medication Sig Start Date End Date Taking? Authorizing Provider  acetaminophen (TYLENOL) 325 MG tablet Take 650 mg by mouth every 4 (four) hours as needed.    [provider]  albuterol (PROVENTIL HFA;VENTOLIN HFA) 108 (90 Base) MCG/ACT inhaler Inhale into the lungs 4 (four) times daily as needed for wheezing or shortness of breath.    [provider]  allopurinol (ZYLOPRIM) 100 MG tablet Take 100 mg by mouth daily.    [provider]  amLODipine (NORVASC) 5 MG tablet Take 5 mg by mouth daily.    [provider]  aspirin 325 MG tablet Take 325 mg by mouth daily.    [provider]  bacitracin  ointment Apply 1 application topically 2 (two) times daily. 12/01/17   Eber Hong, MD  cholecalciferol (VITAMIN D) 1000 units tablet Take 1,000 Units by mouth daily.    [provider]  cyanocobalamin 500 MCG tablet Take 500 mcg by mouth daily.    [provider]  ferrous sulfate 325 (65 FE) MG tablet Take 325 mg by mouth daily with breakfast.    [provider]  magnesium oxide (MAG-OX) 400 MG tablet Take 400 mg by mouth 2 (two) times daily.    [provider]  metFORMIN (GLUCOPHAGE) 500 MG tablet Take 500 mg by mouth 3 (three) times daily.    [provider]  metoprolol tartrate (LOPRESSOR) 25 MG tablet Take 25 mg by mouth 2 (two) times daily.    [provider]  Probiotic Product (RISA-BID PROBIOTIC) TABS Take 1 tablet by mouth daily.    [provider]  senna (SENOKOT) 8.6 MG TABS tablet Take 1 tablet by mouth daily as needed for mild constipation.    [provider]  tolnaftate (TINACTIN) 1 % spray Apply 1 application topically 2 (two) times daily.    [provider]    Family History No family history on file.  Social History Social History   Tobacco Use  . Smoking status: Never Smoker  . Smokeless tobacco: Current User  Substance Use Topics  . Alcohol use: No  Frequency: Never  . Drug use: No     Allergies   Patient has no known allergies.   Review of Systems Review of Systems  Unable to perform ROS: Acuity of condition  Constitutional: Negative for fever.  Neurological: Positive for tremors, facial asymmetry and speech difficulty.  Psychiatric/Behavioral: Negative for agitation.     Physical Exam Updated Vital Signs There were no vitals taken for this visit.  Physical Exam  Constitutional: She appears well-developed and well-nourished.  HENT:  Head: Normocephalic and atraumatic.  Right Ear: External ear normal.  Left Ear: External ear normal.  Gag reflex present  Eyes:  Conjunctivae are normal.  Pinpoint pupils, right gaze preference  Neck: No tracheal deviation present.  Cardiovascular: Normal rate, regular rhythm, normal heart sounds and intact distal pulses.  Pulmonary/Chest: No stridor. She has no wheezes. She has no rales.  Abdominal: Soft. Bowel sounds are normal. There is no tenderness.  Musculoskeletal: She exhibits no edema.  Neurological: A cranial nerve deficit is present. GCS eye subscore is 4. GCS verbal subscore is 1. GCS motor subscore is 4.  Skin: Skin is warm and dry. Capillary refill takes less than 2 seconds. She is not diaphoretic.  Psychiatric:  unable     ED Treatments / Results  Labs (all labs ordered are listed, but only abnormal results are displayed) Results for orders placed or performed during the hospital encounter of 06/26/18  Protime-INR  Result Value Ref Range   Prothrombin Time 12.7 11.4 - 15.2 seconds   INR 0.96   APTT  Result Value Ref Range   aPTT 24 24 - 36 seconds  CBC  Result Value Ref Range   WBC 9.0 4.0 - 10.5 K/uL   RBC 3.65 (L) 3.87 - 5.11 MIL/uL   Hemoglobin 11.8 (L) 12.0 - 15.0 g/dL   HCT 16.1 09.6 - 04.5 %   MCV 107.9 (H) 78.0 - 100.0 fL   MCH 32.3 26.0 - 34.0 pg   MCHC 29.9 (L) 30.0 - 36.0 g/dL   RDW 40.9 81.1 - 91.4 %   Platelets 120 (L) 150 - 400 K/uL  Differential  Result Value Ref Range   Neutrophils Relative % 57 %   Neutro Abs 5.1 1.7 - 7.7 K/uL   Lymphocytes Relative 32 %   Lymphs Abs 2.9 0.7 - 4.0 K/uL   Monocytes Relative 6 %   Monocytes Absolute 0.6 0.1 - 1.0 K/uL   Eosinophils Relative 5 %   Eosinophils Absolute 0.4 0.0 - 0.7 K/uL   Basophils Relative 0 %   Basophils Absolute 0.0 0.0 - 0.1 K/uL   Immature Granulocytes 0 %   Abs Immature Granulocytes 0.0 0.0 - 0.1 K/uL  Comprehensive metabolic panel  Result Value Ref Range   Sodium 139 135 - 145 mmol/L   Potassium 4.8 3.5 - 5.1 mmol/L   Chloride 100 98 - 111 mmol/L   CO2 27 22 - 32 mmol/L   Glucose, Bld 150 (H) 70 - 99  mg/dL   BUN 37 (H) 8 - 23 mg/dL   Creatinine, Ser 7.82 (H) 0.44 - 1.00 mg/dL   Calcium 8.9 8.9 - 95.6 mg/dL   Total Protein 6.1 (L) 6.5 - 8.1 g/dL   Albumin 3.2 (L) 3.5 - 5.0 g/dL   AST 14 (L) 15 - 41 U/L   ALT 9 0 - 44 U/L   Alkaline Phosphatase 58 38 - 126 U/L   Total Bilirubin 0.7 0.3 - 1.2 mg/dL   GFR calc non  Af Amer 24 (L) >60 mL/min   GFR calc Af Amer 28 (L) >60 mL/min   Anion gap 12 5 - 15  I-stat troponin, ED  Result Value Ref Range   Troponin i, poc 0.04 0.00 - 0.08 ng/mL   Comment 3          CBG monitoring, ED  Result Value Ref Range   Glucose-Capillary 146 (H) 70 - 99 mg/dL  I-Stat Chem 8, ED  Result Value Ref Range   Sodium 137 135 - 145 mmol/L   Potassium 4.7 3.5 - 5.1 mmol/L   Chloride 101 98 - 111 mmol/L   BUN 37 (H) 8 - 23 mg/dL   Creatinine, Ser 1.611.70 (H) 0.44 - 1.00 mg/dL   Glucose, Bld 096147 (H) 70 - 99 mg/dL   Calcium, Ion 0.451.09 (L) 1.15 - 1.40 mmol/L   TCO2 26 22 - 32 mmol/L   Hemoglobin 12.6 12.0 - 15.0 g/dL   HCT 40.937.0 81.136.0 - 91.446.0 %   Dg Chest Portable 1 View  Result Date: 06/26/2018 CLINICAL DATA:  Altered mental status EXAM: PORTABLE CHEST 1 VIEW COMPARISON:  None. FINDINGS: Cardiac shadows within normal limits. Aortic calcifications are seen. The lungs are well aerated bilaterally. No focal infiltrate or sizable effusion is noted. No bony abnormality is seen. IMPRESSION: No active disease. Electronically Signed   By: Alcide CleverMark  Lukens M.D.   On: 06/26/2018 01:53   Ct Head Code Stroke Wo Contrast  Result Date: 06/26/2018 CLINICAL DATA:  Code stroke. Left-sided facial droop, right-sided gaze, right dilated pupil. EXAM: CT HEAD WITHOUT CONTRAST TECHNIQUE: Contiguous axial images were obtained from the base of the skull through the vertex without intravenous contrast. COMPARISON:  12/01/2017 CT head FINDINGS: Brain: No evidence of acute infarction, hemorrhage, hydrocephalus, extra-axial collection or mass lesion/mass effect. Advanced chronic microvascular ischemic  changes, volume loss of the brain, small chronic infarct in left hemi pons, and multiple small chronic infarcts in the bilateral basal ganglia are stable in comparison with prior CT of the head. Vascular: Calcific atherosclerosis of the carotid siphons and vertebral arteries. No hyperdense vessel identified. Skull: Normal. Negative for fracture or focal lesion. Sinuses/Orbits: No acute finding. Other: None. ASPECTS [facility]([place] Stroke Program Early CT Score) - Ganglionic level infarction (caudate, lentiform nuclei, internal capsule, insula, M1-M3 cortex): 7 - Supraganglionic infarction (M4-M6 cortex): 3 Total score (0-10 with 10 being normal): 10 IMPRESSION: 1. No acute intracranial abnormality identified. 2. ASPECTS is 10 3. Stable advanced chronic microvascular ischemic changes, volume loss of the brain, and multiple small chronic infarcts in the basal ganglia as well as pons. These results were communicated to Dr. Laurence SlateAroor at 1:40 amon 8/12/2019by text page via the [facility] messaging system. Electronically Signed   By: Mitzi HansenLance  Furusawa-Stratton M.D.   On: 06/26/2018 01:41    EKG  EKG Interpretation  Date/Time:  Monday June 26 2018 01:42:11 EDT Ventricular Rate:  68 PR Interval:    QRS Duration: 95 QT Interval:  440 QTC Calculation: 468 R Axis:   44 Text Interpretation:  Sinus rhythm Confirmed by Nicanor AlconPalumbo, Micholas Drumwright ([id number]) on 06/26/2018 2:15:56 AM       Radiology No results found.  Procedures Procedures (including critical care time)  Medications Ordered in ED Medications  naloxone (NARCAN) injection 1 mg (has no administration in time range)  levETIRAcetam (KEPPRA) IVPB 1000 mg/100 mL premix (has no administration in time range)     Final Clinical Impressions(s) / ED Diagnoses   Will admit for AMS  Aryella Besecker, MD 06/26/18 [id number]

## 2018-06-26 NOTE — Progress Notes (Signed)
LTM running. No skin break down at time of hook up.

## 2018-06-26 NOTE — Progress Notes (Signed)
Bedside EEG completed, results pending. 

## 2018-06-26 NOTE — Plan of Care (Signed)
OT Goals initiated

## 2018-06-26 NOTE — Care Management Note (Signed)
Case Management Note  Patient Details  Name: Judith Turner MRN: [id number] Date of Birth: 10/27/1923  Subjective/Objective:     Pt admitted with acute metabolic encephalopathy. [facility].                Action/Plan: Plan is for [facility] to [facility].   Contact person is daughter: Brenda::[phone]  Expected Discharge Date:                  Expected Discharge Plan:  Skilled Nursing Facility  In-House Referral:  Clinical Social Work  Discharge planning Services     Post Acute Care Choice:    Choice offered to:     DME Arranged:    DME Agency:     HH Arranged:    HH Agency:     Status of Service:  In process, will continue to follow  If discussed at Long Length of Stay Meetings, dates discussed:    Additional Comments:  Kermit BaloKelli F Jolisa Intriago, RN 06/26/2018, 3:05 PM

## 2018-06-26 NOTE — ED Triage Notes (Signed)
Per EMS. Pt was last seen normal about noon, at that time she has slurred speech which was attributed to pain medication.  Hx of TIA and strokes but no deficits.  Facial droop to the right, flaccid.  Minimal gag reflex.

## 2018-06-26 NOTE — ED Notes (Signed)
Blood tubed to main lab. 

## 2018-06-26 NOTE — Consult Note (Addendum)
Requesting Physician: Dr. Sherril CongPAULUMBO    Chief Complaint: Unresponsive  History obtained from: Patient and Chart    HPI:                                                                                                                                       Judith Turner is an 84 y.o. female's medical history of hypertension, hyperlipidemia, diabetes mellitus, COPD, stroke presents as a stroke alert after being found unresponsive in her nursing home.  Last seen normal likely in the morning, his daughter visited her in the afternoon and noted her to be very confused, having slurred speech and hallucinating.  She would have episodes where she was drowsy and when the daughter left at 5 PM patient was asleep.  It was thought her altered behavior was likely medication effect from tramadol which she received for pain.  Patient still unresponsive and EMS was called.  [facility], ER patient had eyes open, nonverbal, pupils were unequal but reactive and would withdraw to pain equally on both sides.  EDP first assessed the patient felt patient preferred the right side however not noted on my examination.  ED workup CT head showed no acute findings.  Had advanced microvascular disease and old and subcortical infarcts. UA concerning for UTI. Noted to be hypertensive in 170-200 systolic.      Date last known well: unclear, morning of 8.11.19 tPA Given:no, outside window NIHSS: 19 Baseline MRS 2    Past Medical History:  Diagnosis Date  . COPD (chronic obstructive pulmonary disease) (HCC)   . Diabetes mellitus without complication (HCC)   . Gout   . Hyperlipidemia   . Hypertension   . Renal disorder    Stage 1 renal failure  . Stroke [facility](HCC)    TIA no residual  deficits    History reviewed. No pertinent surgical history.  No family history on file. Social History:  reports that she has never smoked. She uses smokeless tobacco. She reports that she does not drink alcohol or use  drugs.  Allergies: No Known Allergies  Medications:                                                                                                                        I reviewed home medications   ROS:  14 systems reviewed and negative except above   Examination:                                                                                                      General: Appears well nourished Psych: lethargic, Eyes: No scleral injection HENT: No OP obstrucion Head: Normocephalic.  Cardiovascular: Normal rate and regular rhythm.  Respiratory: Effort normal and breath sounds normal to anterior ascultation GI: Soft.  No distension. There is no tenderness.  Skin: WDI    Neurological Examination Mental Status: Lethargic, non-verbal  Cranial Nerves: II: Visual fields : unable to assess, appears to blink to threat bilaterally, pupils unequal 3mm on right, 2mm on left but reactive III,IV, VI: No forced gaze deviation  V,VII:possible mild left facial droop  VIII: unable to asess IX,X: unable to assess XI: unable to assess XII: midline tongue  Motor: moves antigravity in all 4 extremities to noxious stimuli  Tone and bulk:normal tone throughout; no atrophy noted Sensory: withdraws to pain bilaterally Deep Tendon Reflexes: 2+ and symmetric throughout Plantars: Right: downgoing   Left: downgoing Cerebellar: Unable to assess Gait: unable to assess     Lab Results: Basic Metabolic Panel: Recent Labs  Lab 06/26/18 0132  NA 137  K 4.7  CL 101  GLUCOSE 147*  BUN 37*  CREATININE 1.70*    CBC: Recent Labs  Lab 06/26/18 0126 06/26/18 0132  WBC 9.0  --   NEUTROABS 5.1  --   HGB 11.8* 12.6  HCT 39.4 37.0  MCV 107.9*  --   PLT 120*  --     Coagulation Studies: No results for input(s): LABPROT, INR in the last 72  hours.  Imaging: Dg Chest Portable 1 View  Result Date: 06/26/2018 CLINICAL DATA:  Altered mental status EXAM: PORTABLE CHEST 1 VIEW COMPARISON:  None. FINDINGS: Cardiac shadows within normal limits. Aortic calcifications are seen. The lungs are well aerated bilaterally. No focal infiltrate or sizable effusion is noted. No bony abnormality is seen. IMPRESSION: No active disease. Electronically Signed   By: Alcide Clever M.D.   On: 06/26/2018 01:53   Ct Head Code Stroke Wo Contrast  Result Date: 06/26/2018 CLINICAL DATA:  Code stroke. Left-sided facial droop, right-sided gaze, right dilated pupil. EXAM: CT HEAD WITHOUT CONTRAST TECHNIQUE: Contiguous axial images were obtained from the base of the skull through the vertex without intravenous contrast. COMPARISON:  12/01/2017 CT head FINDINGS: Brain: No evidence of acute infarction, hemorrhage, hydrocephalus, extra-axial collection or mass lesion/mass effect. Advanced chronic microvascular ischemic changes, volume loss of the brain, small chronic infarct in left hemi pons, and multiple small chronic infarcts in the bilateral basal ganglia are stable in comparison with prior CT of the head. Vascular: Calcific atherosclerosis of the carotid siphons and vertebral arteries. No hyperdense vessel identified. Skull: Normal. Negative for fracture or focal lesion. Sinuses/Orbits: No acute finding. Other: None. ASPECTS [facility] Stroke Program Early CT Score) - Ganglionic level infarction (caudate, lentiform nuclei, internal capsule, insula, M1-M3 cortex): 7 - Supraganglionic infarction (M4-M6 cortex): 3 Total score (0-10 with 10 being normal): 10  IMPRESSION: 1. No acute intracranial abnormality identified. 2. ASPECTS is 10 3. Stable advanced chronic microvascular ischemic changes, volume loss of the brain, and multiple small chronic infarcts in the basal ganglia as well as pons. These results were communicated to Dr. Laurence SlateAroor at 1:40 amon 8/12/2019by text page via the [facility]  messaging system. Electronically Signed   By: Mitzi HansenLance  Furusawa-Stratton M.D.   On: 06/26/2018 01:41     ASSESSMENT AND PLAN   84 y.o. female's medical history of hypertension, hyperlipidemia, diabetes mellitus, COPD, stroke presents as a stroke alert after being found unresponsive in her nursing home. CT head was negative for bleed. Patient outside the window for TPA.   Less likely to be LVO as CT head from greater than 10 hours from last known normal shows no early ischemic changes and patient withdrawing all 4 extremities equally and no forced gaze deviation. Another possibility is patient possibly could have had a seizure and is postictal.  No nystagmus, rhythmic movement seen.  Patient does have asterixis on exam.  Recommended ABG.  Impression  Encephalopathy - Metabolic vs infectious vs Seizures prolonged postictal state vs CVA VS HTN emergency  Urinary tract infection   Recommendations MRI Arlys JohnBrian  Routine EEG in the morning  Treat UTI  Check Ammonia  Will hold of starting AEDs at this point     Sushanth Aroor [facility] Pager Number [phone]

## 2018-06-26 NOTE — Consult Note (Signed)
WOC Nurse wound consult note [facility] in the presence of her daughter and with the assistance of her primary care RN. Reason for Consult: Left leg wound Wound type: Unclear etiology Injury POA: Yes Measurement: 3 cm x 0.3 cm Wound bed: 100% eschar Drainage (amount, consistency, odor) no drainage, no odor, no induration Periwound: Normal color and texture skin. Dressing procedure/placement/frequency: Betadine to wound, allow to dry, cover with a foam dressing that can remain in place up to 3 days. The primary care RN asked me to look at the patient's buttocks.  The patient had an incontinent brief on.  The buttocks, gluteal cleft, and coccyx area has moisture associated skin damage from urinary incontinence.  There is no open wound to the area, only an MASD fissure to the gluteal fold.  Plan for this area:  Twice daily application of 40% Desitin; a foam dressing, change every 3 days; an air mattress, turing every 2 hours; and floating the heels to prevent heel injury. Monitor the wound area(s) for worsening of condition such as: Signs/symptoms of infection,  Increase in size,  Development of or worsening of odor, Development of pain, or increased pain at the affected locations.  Notify the medical team if any of these develop.  Thank you for the consult.  Discussed plan of care with the patient's daughter and bedside nurse.  WOC nurse will not follow at this time.  Please re-consult the WOC team if needed.  Helmut MusterSherry Alleah Dearman, RN, MSN, CWOCN, CNS-BC, pager [phone]

## 2018-06-26 NOTE — H&P (Addendum)
History and Physical    Judith Safeannie Centner OZH:[id number] DOB: 10/09/1923 DOA: 06/26/2018  Referring MD/NP/PA:   PCP: System, Pcp Not In   Patient coming from:  The patient is coming from SNF.  At baseline, pt is dependent for most of ADL.   Chief Complaint: AMS and unresponsiveness.  HPI: Judith Turner is a 84 y.o. female with medical history significant of hypertension, hyperlipidemia, diabetes mellitus, COPD, stroke, gout, who presents with altered mental status.  Patient has AMS and in unresponsive, is unable to provide any medical history, therefore, most of the history is obtained by discussing the case with ED physician, per EMS report, and with the nursing staff.  Per report, pt was last seen normal likely in the morning. She was noted to be very confused by her daughter in afternoon which is worse than her baseline. At baseline, pt is reportedly to be able to talk and walk. She is oriented to place and person time to times. She also has slurred speech and hallucination per report. Per EDP, pt had body twitching activity in ED. She moves all extremities upon painful stimuli. It was thought her altered behavior was likely medication effect from tramadol which she received for pain. No active cough, respiratory distress, nausea vomiting diarrhea noted.  Not sure if patient has any pain anywhere.Bp was elevated at 200/58-->172/74 now.  ED Course: pt was found to have WBC 9.0, INR 0.96, PTT 24, troponin negative, positive urinalysis (moderate amount of leukocyte, positive nitrite, WBC 21-50, few bacteria and hazy appearance), creatinine 1.37, BUN 37, no tachycardia, RR 25, oxygen saturation 92 to 95% on room air.  Chest x-ray negative. CT head showed no acute findings, but showed advanced microvascular disease and old and subcortical infarcts. Pt is admitted to telemetry bed as inpatient.  Dr. Laurence SlateAroor of neurology was consulted.  Review of Systems: Could not be reviewed due to  unresponsiveness.  Allergy: No Known Allergies  Past Medical History:  Diagnosis Date  . COPD (chronic obstructive pulmonary disease) (HCC)   . Diabetes mellitus without complication (HCC)   . Gout   . Hyperlipidemia   . Hypertension   . Renal disorder    Stage 1 renal failure  . Stroke [facility](HCC)    TIA no residual  deficits    History reviewed. No pertinent surgical history.  Social History:  reports that she has never smoked. She uses smokeless tobacco. She reports that she does not drink alcohol or use drugs.  Family History: Could not be reviewed due to unresponsiveness.  Prior to Admission medications   Medication Sig Start Date End Date Taking? Authorizing Provider  acetaminophen (TYLENOL) 325 MG tablet Take 650 mg by mouth every 4 (four) hours as needed.   Yes [provider]  albuterol (PROVENTIL HFA;VENTOLIN HFA) 108 (90 Base) MCG/ACT inhaler Inhale into the lungs 4 (four) times daily as needed for wheezing or shortness of breath.   Yes [provider]  allopurinol (ZYLOPRIM) 100 MG tablet Take 100 mg by mouth daily.   Yes [provider]  amLODipine (NORVASC) 5 MG tablet Take 5 mg by mouth daily.   Yes [provider]  aspirin 325 MG tablet Take 325 mg by mouth daily.   Yes [provider]  cholecalciferol (VITAMIN D) 1000 units tablet Take 1,000 Units by mouth daily.   Yes [provider]  cyanocobalamin 500 MCG tablet Take 500 mcg by mouth daily.   Yes [provider]  magnesium oxide (MAG-OX) 400 MG  tablet Take 400 mg by mouth 2 (two) times daily.   Yes [provider]  metFORMIN (GLUCOPHAGE) 500 MG tablet Take 500 mg by mouth 3 (three) times daily.   Yes [provider]  metoprolol tartrate (LOPRESSOR) 25 MG tablet Take 25 mg by mouth daily.    Yes [provider]  phenazopyridine (PYRIDIUM) 100 MG tablet Take 100 mg by mouth 3 (three) times daily as needed for pain.   Yes [provider]  phenazopyridine (PYRIDIUM) 100 MG tablet Take 100 mg by mouth 3 (three) times daily with meals.   Yes [provider]  Probiotic Product (RISA-BID PROBIOTIC) TABS Take 1 tablet by mouth daily.   Yes [provider]  senna (SENOKOT) 8.6 MG TABS tablet Take 1 tablet by mouth daily as needed for mild constipation.   Yes [provider]  traMADol (ULTRAM) 50 MG tablet Take 50 mg by mouth 3 (three) times daily as needed for moderate pain.   Yes [provider]  bacitracin ointment Apply 1 application topically 2 (two) times daily. Patient not taking: Reported on 06/26/2018 12/01/17   Eber Hong, MD    Physical Exam: Vitals:   06/26/18 0145 06/26/18 0200  BP: (!) 200/58 (!) 172/74  Pulse: 66 64  Resp: (!) 25 (!) 22  SpO2: 92% 95%   General: Not in acute distress HEENT:       Eyes: PERRL, EOMI, no scleral icterus.       ENT: No discharge from the ears and nose.       Neck: No JVD, no bruit, no mass felt. Heme: No neck lymph node enlargement. Cardiac: S1/S2, RRR, No murmurs, No gallops or rubs. Respiratory: No rales, wheezing, rhonchi or rubs. GI: Soft, nondistended, nontender, no organomegaly, BS present. GU: No hematuria Ext: 1+ pitting leg edema bilaterally. 2+DP/PT pulse bilaterally. Musculoskeletal: No joint deformities, No joint redness or warmth, no limitation of ROM in spin. Skin: No rashes.  Neuro: Unresponsive, cranial nerves II-XII grossly intact, moves all extremities upon painful stimuli. Psych: Patient is not psychotic, no suicidal or hemocidal ideation.  Labs on Admission: I have personally reviewed following labs and imaging studies  CBC: Recent Labs  Lab 06/26/18 0126 06/26/18 0132  WBC 9.0  --   NEUTROABS 5.1  --   HGB 11.8* 12.6  HCT 39.4 37.0  MCV 107.9*  --   PLT 120*  --    Basic Metabolic Panel: Recent Labs  Lab 06/26/18 0126 06/26/18 0132  NA 139 137  K 4.8 4.7  CL 100 101  CO2 27  --    GLUCOSE 150* 147*  BUN 37* 37*  CREATININE 1.74* 1.70*  CALCIUM 8.9  --    GFR: CrCl cannot be calculated (Unknown ideal weight.). Liver Function Tests: Recent Labs  Lab 06/26/18 0126  AST 14*  ALT 9  ALKPHOS 58  BILITOT 0.7  PROT 6.1*  ALBUMIN 3.2*   No results for input(s): LIPASE, AMYLASE in the last 168 hours. No results for input(s): AMMONIA in the last 168 hours. Coagulation Profile: Recent Labs  Lab 06/26/18 0126  INR 0.96   Cardiac Enzymes: No results for input(s): CKTOTAL, CKMB, CKMBINDEX, TROPONINI in the last 168 hours. BNP (last 3 results) No results for input(s): PROBNP in the last 8760 hours. HbA1C: No results for input(s): HGBA1C in the last 72 hours. CBG: Recent Labs  Lab 06/26/18 0124  GLUCAP 146*   Lipid Profile: No results for input(s): CHOL, HDL, LDLCALC,  TRIG, CHOLHDL, LDLDIRECT in the last 72 hours. Thyroid Function Tests: No results for input(s): TSH, T4TOTAL, FREET4, T3FREE, THYROIDAB in the last 72 hours. Anemia Panel: No results for input(s): VITAMINB12, FOLATE, FERRITIN, TIBC, IRON, RETICCTPCT in the last 72 hours. Urine analysis:    Component Value Date/Time   COLORURINE AMBER (A) 06/26/2018 0222   APPEARANCEUR HAZY (A) 06/26/2018 0222   LABSPEC 1.013 06/26/2018 0222   PHURINE 5.0 06/26/2018 0222   GLUCOSEU NEGATIVE 06/26/2018 0222   HGBUR SMALL (A) 06/26/2018 0222   BILIRUBINUR NEGATIVE 06/26/2018 0222   KETONESUR NEGATIVE 06/26/2018 0222   PROTEINUR 30 (A) 06/26/2018 0222   NITRITE POSITIVE (A) 06/26/2018 0222   LEUKOCYTESUR MODERATE (A) 06/26/2018 0222   Sepsis Labs: @LABRCNTIP (procalcitonin:4,lacticidven:4) )No results found for this or any previous visit (from the past 240 hour(s)).   Radiological Exams on Admission: Dg Chest Portable 1 View  Result Date: 06/26/2018 CLINICAL DATA:  Altered mental status EXAM: PORTABLE CHEST 1 VIEW COMPARISON:  None. FINDINGS: Cardiac shadows within normal limits. Aortic  calcifications are seen. The lungs are well aerated bilaterally. No focal infiltrate or sizable effusion is noted. No bony abnormality is seen. IMPRESSION: No active disease. Electronically Signed   By: Alcide Clever M.D.   On: 06/26/2018 01:53   Ct Head Code Stroke Wo Contrast  Result Date: 06/26/2018 CLINICAL DATA:  Code stroke. Left-sided facial droop, right-sided gaze, right dilated pupil. EXAM: CT HEAD WITHOUT CONTRAST TECHNIQUE: Contiguous axial images were obtained from the base of the skull through the vertex without intravenous contrast. COMPARISON:  12/01/2017 CT head FINDINGS: Brain: No evidence of acute infarction, hemorrhage, hydrocephalus, extra-axial collection or mass lesion/mass effect. Advanced chronic microvascular ischemic changes, volume loss of the brain, small chronic infarct in left hemi pons, and multiple small chronic infarcts in the bilateral basal ganglia are stable in comparison with prior CT of the head. Vascular: Calcific atherosclerosis of the carotid siphons and vertebral arteries. No hyperdense vessel identified. Skull: Normal. Negative for fracture or focal lesion. Sinuses/Orbits: No acute finding. Other: None. ASPECTS [facility] Stroke Program Early CT Score) - Ganglionic level infarction (caudate, lentiform nuclei, internal capsule, insula, M1-M3 cortex): 7 - Supraganglionic infarction (M4-M6 cortex): 3 Total score (0-10 with 10 being normal): 10 IMPRESSION: 1. No acute intracranial abnormality identified. 2. ASPECTS is 10 3. Stable advanced chronic microvascular ischemic changes, volume loss of the brain, and multiple small chronic infarcts in the basal ganglia as well as pons. These results were communicated to Dr. Laurence Slate at 1:40 amon 8/12/2019by text page via the [facility] messaging system. Electronically Signed   By: Mitzi Hansen M.D.   On: 06/26/2018 01:41     EKG: Independently reviewed.  Sinus rhythm, LAE, anteroseptal infarction  pattern   Assessment/Plan Principal Problem:   Acute metabolic encephalopathy Active Problems:   Hyperlipidemia   COPD (chronic obstructive pulmonary disease) (HCC)   Diabetes mellitus without complication (HCC)   Gout   Stroke (HCC)   Hypertensive urgency   AKI (acute kidney injury) (HCC)   Skin ulcer-left leg   UTI (urinary tract infection)  Acute metabolic encephalopathy and unresponsiveness.  Etiology is not clear.  CT head is negative for acute intracranial abnormalities.  Differential diagnosis includes seizure, stroke and hypertensive encephalopathy. UTI may have contributed partially.  Dr. Laurence Slate of neurology was consulted, who ordered MRI of the brain.  -Admit to telemetry bed as inpatient. -Follow-up MRI for brain and Dr. Delaney Meigs recommendations. -Patient was given 1 g of Keppra--> follow-up Dr. Delaney Meigs recommendation  for further dosing -Frequent neuro check -Seizure precaution -When necessary Ativan for seizure -A1c and FLP - 6120m of narco was ordered by EDp -ASA per rectum -Hold all oral medications until mental status improves -NPO now  Hyperlipidemia: not on med at home -f/u FLP  COPD (chronic obstructive pulmonary disease) (HCC): -prn albuterol nebs  Diabetes mellitus without complication (HCC): Last A1c not on recored. Patient is taking metformin at home -SSI  Gout: -Hold all oral medications until mental status improves  Hx of Stroke [facility](HCC): -ASA per rectum  Hypertensive urgency: Bp 200/58 -IV hydralazine as needed for blood pressure>220 and pending MRI of brain  AKI (acute kidney injury) (HCC): Creatinine 1.37, BUN 37. -check FeNa -IVF: 75 cc/h  Skin ulcer-left leg: -Wound care consult  UTI (urinary tract infection): -IV Rocephin -Follow-up blood culture and urine culture   Inpatient status:  # Patient requires inpatient status due to high intensity of service, high risk for further deterioration and high frequency of surveillance required.   I certify that at the point of admission it is my clinical judgment that the patient will require inpatient hospital care spanning beyond 2 midnights from the point of admission.  This patient has multiple chronic comorbidities including  hypertension, hyperlipidemia, diabetes mellitus, COPD, stroke, gout, who presents with altered mental status. .  . Now patient has presenting symptoms include unresponsiveness and AMS,  body twitching activity. . The worrisome physical exam findings include unresponsiveness . The initial radiographic and laboratory data are worrisome because of positive UA for UTI. Pt also has significantly elevated Bp 200/58. . Current medical needs: please see my assessment and plan        DVT ppx: SQ Lovenox Code Status: Full code Family Communication: None at bed side. Disposition Plan:  Anticipate discharge back to previous SNF Consults called:  Dr. Laurence SlateAroor of Neuro Admission status:   Inpatient/tele         Date of Service 06/26/2018    Lorretta HarpXilin Sharlize Hoar [facility] Pager [phone]  If 7PM-7AM, please contact night-coverage [email or web address] Password [email or web address] 06/26/2018, 3:38 AM

## 2018-06-26 NOTE — Progress Notes (Addendum)
Subjective: Patient currently in bed.  She is rolled on her right side.  Initially had difficult time awakening her.  Once I did get her to become alert patient was moaning.  With sternal rub she did localize with her left hand.  Is awake she was shivering and had tremor throughout especially on her left arm however with noxious stimuli she quickly batted me away.  Per daughter patient was talking and normal however she was given tramadol 2 days prior.  Immediately the daughter noted that she was hallucinating and acting as if she was on "LSD".  Exam: Vitals:   06/26/18 0900 06/26/18 1200  BP: (!) 192/99 (!) 214/84  Pulse: 73 72  Resp: 18 20  Temp: 97.9 F (36.6 C) (!) 97.5 F (36.4 C)  SpO2: 98% 98%    Physical Exam   HEENT-  Normocephalic, no lesions, without obvious abnormality.  Normal external eye and conjunctiva.   Extremities- Warm, dry and intact Musculoskeletal-no joint tenderness, deformity or swelling Skin-warm and dry, no hyperpigmentation, vitiligo, or suspicious lesions Neuro:  Mental Status: Alert, moaning, localizing to sternal rub and pain, follows no commands.  Cranial Nerves: II: Blinks to threat on the left however difficult to assess on the right and the way that she is positioned and keeps rolling to the right. III,IV, VI: Doll's intact and pupils 2 mm and reactive.  No forced gaze deviation V,VII: Face symmetrical when head is turned upright however it appears to have a right facial droop when her head is turned to the right.  Motor: Using her left extremity more than the right however she is lean to the right.  With sternal rub she bats me away.  At times that she has a rhythmic jerking of her left arm however I am able to place my hand on her arm and the rhythmic jerking.  In addition with noxious stimuli patient will quickly withdraw her arm and swat at me.  Patient withdraws both legs from noxious stimuli Sensory: As above  Deep Tendon Reflexes: 2+ and  symmetric throughout--no ankle jerk Plantars: Right: downgoing   Left: downgoing      Medications:  Scheduled: . aspirin  300 mg Rectal Daily  . heparin  5,000 Units Subcutaneous Q8H  . insulin aspart  0-9 Units Subcutaneous Q4H  . liver oil-zinc oxide   Topical BID   Continuous: . sodium chloride 75 mL/hr at 06/26/18 1414  . cefTRIAXone (ROCEPHIN)  IV    . cefTRIAXone (ROCEPHIN)  IV 1 g (06/26/18 1410)    Pertinent Labs/Diagnostics: Urine is amber, protein 30, positive nitrite positive leukocytes Creatinine is 1.74 BUN 37   EEG IMPRESSION: This is an abnormal EEG due to a discontinuous rhythm consisting of bursts of slow activity alternating with periods of attenuation.  Clinical correlation recommended.      Mr Brain 43Wo Contrast  Result Date: 06/26/2018 CLINICAL DATA:  84 year old female code stroke, altered mental status, slurred speech, left facial droop. EXAM: MRI HEAD WITHOUT CONTRAST TECHNIQUE: Multiplanar, multiecho pulse sequences of the brain and surrounding structures were obtained without intravenous contrast. COMPARISON:  Head CT without contrast 0131 hours today. Head face and cervical spine CT 12/01/2017. FINDINGS: Brain: No restricted diffusion or evidence of acute infarction. Extensive T2 heterogeneity in the bilateral deep gray matter nuclei appears to reflect a combination of dilated perivascular spaces and chronic lacunes (right caudate and are thalamus). There is a small chronic lacune in the left paracentral pons. There is a tiny chronic  infarct in the right cerebellum (series 7, image 9). Patchy and confluent bilateral cerebral white matter T2 and FLAIR hyperintensity. Occasional chronic micro hemorrhages in the brain, including the left cerebellum on series 9, image 19. No midline shift, mass effect, evidence of mass lesion, ventriculomegaly, extra-axial collection or acute intracranial hemorrhage. Cervicomedullary junction and pituitary are within normal  limits. Vascular: Major intracranial vascular flow voids are preserved. Skull and upper cervical spine: Widespread cervical spine degeneration with mild reversal of lordosis. Hyperostosis of the calvarium. Visible bone marrow signal remains within normal limits. Sinuses/Orbits: Postoperative changes to both globes. Otherwise normal orbits soft tissues. Paranasal sinuses and mastoids are stable and well pneumatized. Other: Grossly normal visible internal auditory structures. Scalp and face soft tissues appear negative. IMPRESSION: 1.  No acute intracranial abnormality. 2. Moderate to advanced signal changes in the brain compatible with chronic small vessel disease. Electronically Signed   By: Odessa FlemingH  Hall M.D.   On: 06/26/2018 08:42   Dg Chest Portable 1 View  Result Date: 06/26/2018 CLINICAL DATA:  Altered mental status EXAM: PORTABLE CHEST 1 VIEW COMPARISON:  None. FINDINGS: Cardiac shadows within normal limits. Aortic calcifications are seen. The lungs are well aerated bilaterally. No focal infiltrate or sizable effusion is noted. No bony abnormality is seen. IMPRESSION: No active disease. Electronically Signed   By: Alcide CleverMark  Lukens M.D.   On: 06/26/2018 01:53   Ct Head Code Stroke Wo Contrast  Result Date: 06/26/2018 IMPRESSION: 1. No acute intracranial abnormality identified. 2. ASPECTS is 10 3. Stable advanced chronic microvascular ischemic changes, volume loss of the brain, and multiple small chronic infarcts in the basal ganglia as well as pons. These results were communicated to Dr. Laurence SlateAroor at 1:40 amon 8/12/2019by text page via the [facility] messaging system. Electronically Signed   By: Mitzi HansenLance  Furusawa-Stratton M.D.   On: 06/26/2018 01:41   Felicie MornDavid Hosack PA-C [facility] [phone]   Assessment: 84 year old female presented to the hospital confusion in the setting of UTI.  MRI was obtained that did not show any acute stroke.  Given the negative MRI most likely encephalopathy in the setting of  significant UTI and elevated blood pressures with systolic blood pressures in the 200s. That said, she has significant amount of generalized body shivering and intermittent ability to follow commands, raising concerns for underlying seizures.  Routine EEG did not catch any seizures but was very consistent and concerning as it showed extensive slowing in severity discontinuous background which is to be expected in someone whose sedated intubated or postictal.   Recommendations: -Control blood pressure -Treat UTI - Agree with discontinuing tramadol as this can provoke seizures - Long-term EEG.  Hold off  on using any antiepileptics for now.  Try Ativan challenge once she has been on long-term EEG. Neurology will continue to follow with you.    06/26/2018, 2:16 PM  CRITICAL CARE ATTESTATION This patient is critically ill and at significant risk of neurological worsening, death and care requires constant monitoring of vital signs, hemodynamics,respiratory and cardiac monitoring. I spent 30  minutes of neurocritical care time performing neurological assessment, discussion with family, other specialists and medical decision making of high complexityin the care of  this patient.

## 2018-06-26 NOTE — Progress Notes (Signed)
SLP Cancellation Note  Patient Details Name: Judith Turner MRN: [id number] DOB: 04/09/1923   Cancelled treatment:        Arrived for swallow eval. EEG tech stated she was completely unresponsive during procedure and not flinching with removal of scalp electrodes. Will continue efforts.     Royce MacadamiaLitaker, Tyannah Sane Willis 06/26/2018, 10:19 AM   Breck CoonsLisa Willis Lonell FaceLitaker M.Ed [employer] Pager [phone]

## 2018-06-26 NOTE — ED Notes (Signed)
CODE STROKE CANCELED PER DR Laurence SlateAROOR

## 2018-06-26 NOTE — Evaluation (Signed)
Physical Therapy Evaluation Patient Details Name: Judith Turner MRN: [id number] DOB: 04/20/1923 Today's Date: 06/26/2018   History of Present Illness  84 y.o. female with medical history significant of hypertension, hyperlipidemia, diabetes mellitus, COPD, stroke, gout, who presents with altered mental status  Clinical Impression  Orders received for PT evaluation. Patient demonstrates deficits in functional mobility as indicated below. Will benefit from continued skilled PT to address deficits and maximize function. Will see as indicated and progress as tolerated.  BP assessed 201/107 O2 98% on 3 liters and HR mid 70s; noted left sided tremor    Follow Up Recommendations SNF;Supervision/Assistance - 24 hour    Equipment Recommendations  None recommended by PT    Recommendations for Other Services       Precautions / Restrictions Precautions Precautions: Fall      Mobility  Bed Mobility Overal bed mobility: Needs Assistance Bed Mobility: Supine to Sit;Sit to Supine     Supine to sit: +2 for physical assistance;Total assist Sit to supine: +2 for physical assistance;Total assist   General bed mobility comments: Patient required total assist via helicopter technique to come to EOB and return to supine  Transfers Overall transfer level: Needs assistance   Transfers: Sit to/from Stand Sit to Stand: Max assist;+2 physical assistance         General transfer comment: patient required assist to elevate trunk and buttocks from bed surface, 2 pherson assit bilaterally. Patient was able to take weight on LEs but unable to maintain upright posture or positioning without physical assist  Ambulation/Gait             General Gait Details: unable to perform  Stairs            Wheelchair Mobility    Modified Rankin (Stroke Patients Only) Modified Rankin (Stroke Patients Only) Pre-Morbid Rankin Score: Moderately severe disability Modified Rankin: Severe  disability     Balance Overall balance assessment: Needs assistance Sitting-balance support: Feet supported Sitting balance-Leahy Scale: Poor Sitting balance - Comments: patient with significant right sided lean, unable to correct or maintain midline Postural control: Right lateral lean                                   Pertinent Vitals/Pain Pain Assessment: Faces Faces Pain Scale: No hurt    Home Living Family/patient expects to be discharged to:: Skilled nursing facility                      Prior Function Level of Independence: Needs assistance   Gait / Transfers Assistance Needed: patient required assist with RW for mobility  ADL's / Homemaking Assistance Needed: dependent for ADLs   Comments: information provided at bedside by daughter     Hand Dominance        Extremity/Trunk Assessment   Upper Extremity Assessment Upper Extremity Assessment: Difficult to assess due to impaired cognition    Lower Extremity Assessment Lower Extremity Assessment: Difficult to assess due to impaired cognition(moving actively but unable to assess formally)    Cervical / Trunk Assessment Cervical / Trunk Assessment: Kyphotic  Communication   Communication: Expressive difficulties  Cognition Arousal/Alertness: Lethargic(improved with upright position) Behavior During Therapy: Flat affect Overall Cognitive Status: Difficult to assess  General Comments      Exercises     Assessment/Plan    PT Assessment Patient needs continued PT services  PT Problem List Decreased strength;Decreased activity tolerance;Decreased balance;Decreased mobility;Decreased coordination;Decreased cognition;Decreased safety awareness       PT Treatment Interventions DME instruction;Gait training;Functional mobility training;Therapeutic activities;Therapeutic exercise;Balance training;Patient/family education;Cognitive  remediation;Neuromuscular re-education    PT Goals (Current goals can be found in the Care Plan section)  Acute Rehab PT Goals Patient Stated Goal: none stated PT Goal Formulation: Patient unable to participate in goal setting Time For Goal Achievement: 07/10/18 Potential to Achieve Goals: Fair    Frequency Min 2X/week   Barriers to discharge        Co-evaluation PT/OT/SLP Co-Evaluation/Treatment: Yes Reason for Co-Treatment: For patient/therapist safety;Necessary to address cognition/behavior during functional activity;Complexity of the patient's impairments (multi-system involvement);To address functional/ADL transfers PT goals addressed during session: Mobility/safety with mobility OT goals addressed during session: ADL's and self-care       AM-PAC PT "6 Clicks" Daily Activity  Outcome Measure Difficulty turning over in bed (including adjusting bedclothes, sheets and blankets)?: Unable Difficulty moving from lying on back to sitting on the side of the bed? : Unable Difficulty sitting down on and standing up from a chair with arms (e.g., wheelchair, bedside commode, etc,.)?: Unable Help needed moving to and from a bed to chair (including a wheelchair)?: Total Help needed walking in hospital room?: Total Help needed climbing 3-5 steps with a railing? : Total 6 Click Score: 6    End of Session Equipment Utilized During Treatment: Oxygen Activity Tolerance: Patient limited by fatigue;Patient limited by lethargy Patient left: in bed;with call bell/phone within reach;with bed alarm set;with family/visitor present(nursing in room and EEG in room)   PT Visit Diagnosis: Other symptoms and signs involving the nervous system (R29.898);Muscle weakness (generalized) (M62.81)    Time: [id number] PT Time Calculation (min) (ACUTE ONLY): 21 min   Charges:   PT Evaluation $PT Eval Moderate Complexity: 1 Mod          Charlotte Crumbevon Apollos Tenbrink, PT DPT  Board Certified Neurologic  Specialist [phone]   Judith Turner 06/26/2018, 10:36 AM

## 2018-06-26 NOTE — Procedures (Signed)
ELECTROENCEPHALOGRAM REPORT   Patient: Judith Turner       Room #: 1O10R3W01C EEG No. ID: [id number] Age: 84 y.o.        Sex: female Referring Physician: Hongalgi Report Date:  06/26/2018        Interpreting Physician: Thana FarrEYNOLDS, Keanu Lesniak  History: Judith Safeannie Balcom is an 84 y.o. female found unresponsive, confused  Medications:  ASA, Rocephin, Insulin, Desitin  Conditions of Recording:  This is a 21 channel routine scalp EEG performed with bipolar and monopolar montages arranged in accordance to the international 10/20 system of electrode placement. One channel was dedicated to EKG recording.  The patient is in the unresponsive state with snoring respirations.  Description:  Description:  The background activity is discontinuous.  It consists of generalized bursts of intermixed, moderate voltage, poorly organized delta and theta activity that is diffusely distributed.  These bursts last from 2-8 seconds.  There are intervening periods of attenuation lasting up to 3 seconds.  These periods of attenuation are generalized and synchronous between the hemispheres.  This discontinuous activity is persistent throughout the recording.    Hyperventilation and intermittent photic stimulation were not performed.  IMPRESSION: This is an abnormal EEG due to a discontinuous rhythm consisting of bursts of slow activity alternating with periods of attenuation.  Clinical correlation recommended.         Thana FarrLeslie Kersten Salmons, MD Neurology [phone] 06/26/2018, 12:21 PM

## 2018-06-26 NOTE — Code Documentation (Signed)
Responded to Code Stroke called at 0104 for L sided droop, R sided gaze, and aphasia.  Pt arrived via EMS at 0121.  LSN-1200 yesterday. NIH-19, CBG-143, SBP-200s. CT negative for acute changes. Code stroke cancelled at 0138.  Plan to admit to medical team.

## 2018-06-26 NOTE — Progress Notes (Addendum)
PROGRESS NOTE   Tersa Fotopoulos  ZOX:[id number]    DOB: 12/10/1922    DOA: 06/26/2018  PCP: System, Pcp Not In   I have briefly reviewed patients previous medical records in [facility].  Brief Narrative:  84 year old female, SNF resident, PMH of HTN, HLD, DM 2, COPD, stroke, gout, presented to the hospital due to altered mental status/unresponsive.  At baseline reportedly able to talk and walk with assistance.  She was in her usual state on the morning of admission, confused with slurred speech and hallucinations in the afternoon.  Reportedly got tramadol for pain.   Assessment & Plan:   Principal Problem:   Acute metabolic encephalopathy Active Problems:   Hyperlipidemia   COPD (chronic obstructive pulmonary disease) (HCC)   Diabetes mellitus without complication (HCC)   Gout   Stroke (HCC)   Hypertensive urgency   AKI (acute kidney injury) (HCC)   Skin ulcer-left leg   UTI (urinary tract infection)   Acute toxic metabolic encephalopathy: Exact etiology not clear.  DD: Metabolic (renal insufficiency, infectious (UTI), toxic (tramadol-could cause jerky involuntary movements in context of renal insufficiency), seizure with prolonged postictal phase, hypertensive emergency, CVA (MRI brain negative).  Neurology consulted and assisted with evaluation.  CT head: No acute abnormalities.  MRI brain: No acute abnormalities.  Chest x-ray: No active disease.  EEG: Abnormal due to discontinuous rhythm consisting of bursts of slow activity alternating with periods of attenuation- neurology follow-up pending.  Avoid opioids or other sedative medications.  Treat UTI.  Baseline creatinine not known so do not know if this is acute or chronic kidney disease.  Mental status may be slightly better, was opening eyes this morning.  Did receive 1 g of Keppra and a dose of Narcan in the ED, further need of AEDs deferred to neurology. NPO until safe to take p.o.  Ammonia normal.  Possible acute lower UTI:  Continue with IV ceftriaxone pending urine culture results.  Hypertensive urgency: PRN IV hydralazine until safe to take p.o.  Acute kidney injury versus acute on chronic kidney disease: Presented with creatinine of 1.74.  Baseline creatinine not known.  Briefly hydrated with IV fluids and follow BMP in a.m.  Hyperlipidemia: Not on medications PTA.  LDL 106.  COPD: Stable without clinical bronchospasm.  DM 2: A1c pending.  Hold metformin.  SSI.  Chronic left leg ulcer: No acute findings.  Wound care team input appreciated.  History of stroke: Aspirin rectally until safe to take p.o.  Macrocytic anemia and thrombocytopenia: Baseline not known.  Check anemia panel (concern for B12 deficiency, patient on B12 supplements at home) and TSH (macrocytosis).  Follow CBC in a.m.  DVT prophylaxis: Subcutaneous Lovenox. Code Status: Full Family Communication: None at bedside Disposition: To be determined   Consultants:  Neurology  Procedures:  EEG  Antimicrobials:  Ceftriaxone   Subjective: Patient interviewed and examined with RN at bedside.  Patient nonverbal.  Briefly opened eyes spontaneously but not following instructions.  Localizes sternal rub with both upper extremities.  ROS: Unable due to mental status change.  Objective:  Vitals:   06/26/18 0503 06/26/18 0700 06/26/18 0900 06/26/18 1200  BP: (!) 174/64 (!) 226/89 (!) 192/99 (!) 214/84  Pulse: (!) 55 65 73 72  Resp: 18 18 18 20   Temp: 98.1 F (36.7 C) 97.6 F (36.4 C) 97.9 F (36.6 C) (!) 97.5 F (36.4 C)  TempSrc: Axillary Axillary Axillary Axillary  SpO2: 98% 98% 98% 98%  Weight: 70.7 kg  Height: 5\' 4"  (1.626 m)       Examination:  General exam: Elderly female, moderately built and nourished lying comfortably propped up in bed. Respiratory system: Clear to auscultation/poor inspiratory effort. Respiratory effort normal. Cardiovascular system: S1 & S2 heard, RRR. No JVD, murmurs, rubs, gallops or clicks.  No pedal edema. Gastrointestinal system: Abdomen is nondistended, soft and nontender. No organomegaly or masses felt. Normal bowel sounds heard. Central nervous system: Mental status as indicated above. No focal neurological deficits. Extremities: Localized sternal rub with both upper extremities.  Moves both lower extremities symmetrically. Skin: Approximately 3 x 0.5 cm vertically long wound noted on left leg laterally which is scabbed and has no acute findings. Psychiatry: Judgement and insight impaired. Mood & affect cannot be assessed.     Data Reviewed: I have personally reviewed following labs and imaging studies  CBC: Recent Labs  Lab 06/26/18 0126 06/26/18 0132  WBC 9.0  --   NEUTROABS 5.1  --   HGB 11.8* 12.6  HCT 39.4 37.0  MCV 107.9*  --   PLT 120*  --    Basic Metabolic Panel: Recent Labs  Lab 06/26/18 0126 06/26/18 0132  NA 139 137  K 4.8 4.7  CL 100 101  CO2 27  --   GLUCOSE 150* 147*  BUN 37* 37*  CREATININE 1.74* 1.70*  CALCIUM 8.9  --    Liver Function Tests: Recent Labs  Lab 06/26/18 0126  AST 14*  ALT 9  ALKPHOS 58  BILITOT 0.7  PROT 6.1*  ALBUMIN 3.2*   Coagulation Profile: Recent Labs  Lab 06/26/18 0126  INR 0.96   CBG: Recent Labs  Lab 06/26/18 0124 06/26/18 0602 06/26/18 1125  GLUCAP 146* 125* 152*    No results found for this or any previous visit (from the past 240 hour(s)).       Radiology Studies: Mr Brain 47Wo Contrast  Result Date: 06/26/2018 CLINICAL DATA:  84 year old female code stroke, altered mental status, slurred speech, left facial droop. EXAM: MRI HEAD WITHOUT CONTRAST TECHNIQUE: Multiplanar, multiecho pulse sequences of the brain and surrounding structures were obtained without intravenous contrast. COMPARISON:  Head CT without contrast 0131 hours today. Head face and cervical spine CT 12/01/2017. FINDINGS: Brain: No restricted diffusion or evidence of acute infarction. Extensive T2 heterogeneity in the  bilateral deep gray matter nuclei appears to reflect a combination of dilated perivascular spaces and chronic lacunes (right caudate and are thalamus). There is a small chronic lacune in the left paracentral pons. There is a tiny chronic infarct in the right cerebellum (series 7, image 9). Patchy and confluent bilateral cerebral white matter T2 and FLAIR hyperintensity. Occasional chronic micro hemorrhages in the brain, including the left cerebellum on series 9, image 19. No midline shift, mass effect, evidence of mass lesion, ventriculomegaly, extra-axial collection or acute intracranial hemorrhage. Cervicomedullary junction and pituitary are within normal limits. Vascular: Major intracranial vascular flow voids are preserved. Skull and upper cervical spine: Widespread cervical spine degeneration with mild reversal of lordosis. Hyperostosis of the calvarium. Visible bone marrow signal remains within normal limits. Sinuses/Orbits: Postoperative changes to both globes. Otherwise normal orbits soft tissues. Paranasal sinuses and mastoids are stable and well pneumatized. Other: Grossly normal visible internal auditory structures. Scalp and face soft tissues appear negative. IMPRESSION: 1.  No acute intracranial abnormality. 2. Moderate to advanced signal changes in the brain compatible with chronic small vessel disease. Electronically Signed   By: Odessa FlemingH  Hall M.D.   On: 06/26/2018 08:42  Dg Chest Portable 1 View  Result Date: 06/26/2018 CLINICAL DATA:  Altered mental status EXAM: PORTABLE CHEST 1 VIEW COMPARISON:  None. FINDINGS: Cardiac shadows within normal limits. Aortic calcifications are seen. The lungs are well aerated bilaterally. No focal infiltrate or sizable effusion is noted. No bony abnormality is seen. IMPRESSION: No active disease. Electronically Signed   By: Alcide CleverMark  Lukens M.D.   On: 06/26/2018 01:53   Ct Head Code Stroke Wo Contrast  Result Date: 06/26/2018 CLINICAL DATA:  Code stroke. Left-sided  facial droop, right-sided gaze, right dilated pupil. EXAM: CT HEAD WITHOUT CONTRAST TECHNIQUE: Contiguous axial images were obtained from the base of the skull through the vertex without intravenous contrast. COMPARISON:  12/01/2017 CT head FINDINGS: Brain: No evidence of acute infarction, hemorrhage, hydrocephalus, extra-axial collection or mass lesion/mass effect. Advanced chronic microvascular ischemic changes, volume loss of the brain, small chronic infarct in left hemi pons, and multiple small chronic infarcts in the bilateral basal ganglia are stable in comparison with prior CT of the head. Vascular: Calcific atherosclerosis of the carotid siphons and vertebral arteries. No hyperdense vessel identified. Skull: Normal. Negative for fracture or focal lesion. Sinuses/Orbits: No acute finding. Other: None. ASPECTS [facility]([place] Stroke Program Early CT Score) - Ganglionic level infarction (caudate, lentiform nuclei, internal capsule, insula, M1-M3 cortex): 7 - Supraganglionic infarction (M4-M6 cortex): 3 Total score (0-10 with 10 being normal): 10 IMPRESSION: 1. No acute intracranial abnormality identified. 2. ASPECTS is 10 3. Stable advanced chronic microvascular ischemic changes, volume loss of the brain, and multiple small chronic infarcts in the basal ganglia as well as pons. These results were communicated to Dr. Laurence SlateAroor at 1:40 amon 8/12/2019by text page via the [facility] messaging system. Electronically Signed   By: Mitzi HansenLance  Furusawa-Stratton M.D.   On: 06/26/2018 01:41        Scheduled Meds: . aspirin  300 mg Rectal Daily  . heparin  5,000 Units Subcutaneous Q8H  . insulin aspart  0-5 Units Subcutaneous QHS  . insulin aspart  0-9 Units Subcutaneous TID WC  . liver oil-zinc oxide   Topical BID   Continuous Infusions: . sodium chloride 75 mL/hr at 06/26/18 1216  . cefTRIAXone (ROCEPHIN)  IV    . cefTRIAXone (ROCEPHIN)  IV       LOS: 0 days     Marcellus ScottAnand Elzina Devera, MD, FACP, [facility]. [facility] Pager [phone]  If 7PM-7AM, please contact night-coverage [email or web address] Password [email or web address] 06/26/2018, 1:03 PM

## 2018-06-27 LAB — GLUCOSE, CAPILLARY
GLUCOSE-CAPILLARY: 115 mg/dL — AB (ref 70–99)
GLUCOSE-CAPILLARY: 107 mg/dL — AB (ref 70–99)
Glucose-Capillary: 113 mg/dL — ABNORMAL HIGH (ref 70–99)
Glucose-Capillary: 118 mg/dL — ABNORMAL HIGH (ref 70–99)
Glucose-Capillary: 124 mg/dL — ABNORMAL HIGH (ref 70–99)
Glucose-Capillary: 124 mg/dL — ABNORMAL HIGH (ref 70–99)

## 2018-06-27 LAB — BASIC METABOLIC PANEL
Anion gap: 12 (ref 5–15)
BUN: 18 mg/dL (ref 8–23)
CALCIUM: 8.7 mg/dL — AB (ref 8.9–10.3)
CO2: 27 mmol/L (ref 22–32)
Chloride: 101 mmol/L (ref 98–111)
Creatinine, Ser: 1.21 mg/dL — ABNORMAL HIGH (ref 0.44–1.00)
GFR, EST AFRICAN AMERICAN: 44 mL/min — AB (ref >60)
GFR, EST NON AFRICAN AMERICAN: 38 mL/min — AB (ref >60)
GLUCOSE: 126 mg/dL — AB (ref 70–99)
Potassium: 4.2 mmol/L (ref 3.5–5.1)
SODIUM: 140 mmol/L (ref 135–145)

## 2018-06-27 LAB — CREATININE, URINE, RANDOM: Creatinine, Urine: 57.6 mg/dL

## 2018-06-27 LAB — CBC
HCT: 39 % (ref 36.0–46.0)
Hemoglobin: 12.3 g/dL (ref 12.0–15.0)
MCH: 32.9 pg (ref 26.0–34.0)
MCHC: 31.5 g/dL (ref 30.0–36.0)
MCV: 104.3 fL — AB (ref 78.0–100.0)
PLATELETS: 142 10*3/uL — AB (ref 150–400)
RBC: 3.74 MIL/uL — ABNORMAL LOW (ref 3.87–5.11)
RDW: 13.9 % (ref 11.5–15.5)
WBC: 9.9 10*3/uL (ref 4.0–10.5)

## 2018-06-27 LAB — SODIUM, URINE, RANDOM: Sodium, Ur: 133 mmol/L

## 2018-06-27 LAB — URINE CULTURE: Culture: NO GROWTH

## 2018-06-27 LAB — HEMOGLOBIN A1C
Hgb A1c MFr Bld: 5.5 % (ref 4.8–5.6)
Mean Plasma Glucose: 111 mg/dL

## 2018-06-27 MED ORDER — CLONIDINE HCL 0.3 MG/24HR TD PTWK
0.3000 mg | MEDICATED_PATCH | TRANSDERMAL | Status: DC
  Administered 2018-06-27: 0.3 mg via TRANSDERMAL
  Filled 2018-06-27: qty 1

## 2018-06-27 MED ORDER — LEVETIRACETAM 500 MG/5ML IV SOLN
750.0000 mg | INTRAVENOUS | Status: AC
  Administered 2018-06-27: 750 mg via INTRAVENOUS
  Filled 2018-06-27: qty 7.5

## 2018-06-27 MED ORDER — LEVETIRACETAM IN NACL 500 MG/100ML IV SOLN
500.0000 mg | Freq: Two times a day (BID) | INTRAVENOUS | Status: DC
  Administered 2018-06-27 – 2018-06-29 (×4): 500 mg via INTRAVENOUS
  Filled 2018-06-27 (×4): qty 100

## 2018-06-27 NOTE — Evaluation (Signed)
Clinical/Bedside Swallow Evaluation Patient Details  Name: Lyndel Safeannie Righter MRN: [id number] Date of Birth: 10/29/1923  Today's Date: 06/27/2018 Time: SLP Start Time (ACUTE ONLY): [id number] SLP Stop Time (ACUTE ONLY): 0939 SLP Time Calculation (min) (ACUTE ONLY): 10 min  Past Medical History:  Past Medical History:  Diagnosis Date  . COPD (chronic obstructive pulmonary disease) (HCC)   . Diabetes mellitus without complication (HCC)   . Gout   . Hyperlipidemia   . Hypertension   . Renal disorder    Stage 1 renal failure  . Stroke [facility](HCC)    TIA no residual  deficits   Past Surgical History: History reviewed. No pertinent surgical history. HPI:  84 year old female, SNF resident, PMH of HTN, HLD, DM 2, COPD, stroke, gout, presented to the hospital due to altered mental status/unresponsive, left facial droop, slurred speech.  Pt also found to have UTI.  MRI brain negative for acute finding, CXR negative.  At baseline reportedly able to talk and walk with assistance per review of MD notes.  She was in her usual state on the morning of admission, confused with slurred speech and hallucinations in the afternoon.   Pt diagnosed with metabolic encephalopathy and has undergone EEG.  Pt has been lethargic during admission.     Assessment / Plan / Recommendation Clinical Impression  Pt currently alert but with very limited ability to follow directions nor adequate awareness to oral boluses provided.  Pt with breathy weak voice when she speaks - and she is echolalic.  SLP assessed pt with single ice chip and tsp nectar thick liquids.  Poor awareness noted resulting in oral holding, suspected delayed transiting concerning for safety with po.  In addition, when pt did initiate swallow, multiple swallows noted across consistencies - concerning for pharyngeal residuals.  Recommend pt be NPO with oral care at least QD.  Pt will benefit from instrumental evaluation when ready for po dependent on pt's/family's goals of  care.  Will follow up for po readiness.   SLP Visit Diagnosis: Dysphagia, oropharyngeal phase (R13.12)    Aspiration Risk  Severe aspiration risk;Risk for inadequate nutrition/hydration    Diet Recommendation NPO;Other (Comment)(oral care)   Medication Administration: Via alternative means    Other  Recommendations Oral Care Recommendations: Oral care QID   Follow up Recommendations (tbd)      Frequency and Duration min 1 x/week  1 week       Prognosis    TBD    Swallow Study   General Date of Onset: 06/27/18 HPI: 84 year old female, SNF resident, PMH of HTN, HLD, DM 2, COPD, stroke, gout, presented to the hospital due to altered mental status/unresponsive, left facial droop, slurred speech.  Pt also found to have UTI.  MRI brain negative for acute finding, CXR negative.  At baseline reportedly able to talk and walk with assistance per review of MD notes.  She was in her usual state on the morning of admission, confused with slurred speech and hallucinations in the afternoon.   Pt diagnosed with metabolic encephalopathy and has undergone EEG.  Pt has been lethargic during admission.   Type of Study: Bedside Swallow Evaluation Diet Prior to this Study: NPO Temperature Spikes Noted: No Respiratory Status: Nasal cannula History of Recent Intubation: No Behavior/Cognition: Alert;Doesn't follow directions Oral Cavity Assessment: Dry Oral Care Completed by SLP: Yes Oral Cavity - Dentition: Other (Comment)(poor dentition) Vision: (per chart review, pt has macular degeneration) Self-Feeding Abilities: Total assist Patient Positioning: Upright in bed Baseline Vocal Quality: Low  vocal intensity;Breathy Volitional Cough: Weak Volitional Swallow: Able to elicit    Oral/Motor/Sensory Function Overall Oral Motor/Sensory Function: Generalized oral weakness(pt did not follow directions for oral motor exam, ? ) Facial ROM: Reduced left Facial Symmetry: Abnormal symmetry left Facial  Strength: Reduced left   Ice Chips Ice chips: Impaired Presentation: Spoon Oral Phase Impairments: Reduced labial seal;Reduced lingual movement/coordination;Poor awareness of bolus Oral Phase Functional Implications: Prolonged oral transit Pharyngeal Phase Impairments: Suspected delayed Swallow   Thin Liquid Thin Liquid: Not tested    Nectar Thick Nectar Thick Liquid: Impaired Presentation: Spoon Oral Phase Impairments: Reduced labial seal;Poor awareness of bolus;Reduced lingual movement/coordination Oral phase functional implications: Prolonged oral transit;Oral holding Pharyngeal Phase Impairments: Suspected delayed Swallow   Honey Thick Honey Thick Liquid: Not tested   Puree Puree: Not tested   Solid     Solid: Not tested      [name], Jodye Scali Ann 06/27/2018,9:59 AM    Donavan Burnetamara Rabon Scholle, MS [facility] SLP [phone]

## 2018-06-27 NOTE — Procedures (Signed)
LTM-EEG Report  HISTORY: Continuous video-EEG monitoring performed for 84 year old with tremors, intermittent confusion, UTI. ACQUISITION: International 10-20 system for electrode placement; 18 channels with additional eyes linked to ipsilateral ears and EKG. Additional T1-T2 electrodes were used. Continuous video recording obtained.   EEG NUMBER:  MEDICATIONS:  Day 1: no AED  DAY #1: from 1547 06/26/18 to 0730 06/27/18 BACKGROUND: An overall medium voltage continuous recording with some spontaneous variability and reactivity. The background consisted of medium voltage, irregular theta-delta activity bilaterally with sparse superimposed faster frequencies and no posterior dominant rhythm. State changes were seen without clear stage II sleep architecture. EPILEPTIFORM/PERIODIC ACTIVITY: There were nearly continuous high voltage, generalized periodic discharges (GPDs) with blunted, triphasic appearance at times, frequency 1-1.5hz  without ictal evolution or definite clinical signs.  SEIZURES: none EVENTS: Clinical events of bilateral arm tremors without EEG correlate  EKG: no significant arrhythmia  SUMMARY: This was a moderately abnormal continuous video EEG due to generalized background slowing and runs of GPDs with triphasic appearance at times. The triphasic appearance of the discharges raises concern for a toxic-metabolic encephalopathy as an etiology, however the GPDs were sharper in appearance at times as well. Events of arm tremors were seen in waking, however the baseline GPDs were unchanged in appearance during these events.

## 2018-06-27 NOTE — Progress Notes (Signed)
PROGRESS NOTE   Judith Turner  XLK:[id number]    DOB: 08/05/1923    DOA: 06/26/2018  PCP: System, Pcp Not In   I have briefly reviewed patients previous medical records in [facility].  Brief Narrative:  84 year old female, SNF resident, PMH of HTN, HLD, DM 2, COPD, stroke, gout, presented to the hospital due to altered mental status/unresponsive.  At baseline reportedly able to talk and walk with assistance.  She was in her usual state on the morning of admission, confused with slurred speech and hallucinations in the afternoon.  Reportedly got tramadol for pain. Neurology consulted   Assessment & Plan:   Principal Problem:   Acute metabolic encephalopathy Active Problems:   Hyperlipidemia   COPD (chronic obstructive pulmonary disease) (HCC)   Diabetes mellitus without complication (HCC)   Gout   Stroke (HCC)   Hypertensive urgency   AKI (acute kidney injury) (HCC)   Skin ulcer-left leg   UTI (urinary tract infection)   Acute toxic metabolic encephalopathy: secondary to acute kidney injury/UTI/tramadol?.  DD: Metabolic (renal insufficiency, infectious (UTI), toxic (tramadol-could cause jerky involuntary movements in context of renal insufficiency), seizure with prolonged postictal phase, hypertensive emergency, CVA (MRI brain negative).  Neurology consulted and assisted with evaluation.  CT head: No acute abnormalities.  MRI brain: No acute abnormalities.  Chest x-ray: No active disease.  EEG: Abnormal due to discontinuous rhythm consisting of bursts of slow activity alternating with periods of attenuation- neurology recommends starting patient on AED , loading dose of Keppra 750 mg IV 1 followed by 500 mg by mouth twice a day Avoid opioids or other sedative medications.  Treat UTI.  Baseline creatinine not known so do not know if this is acute or chronic kidney disease.  Mental status continues to remain altered, was opening eyes this morning/moaning .  Did receive 1 g of Keppra  and a dose of Narcan in the ED, . NPO until safe to take p.o as per speech therapy recommendations which are pending.  Ammonia normal.vitamin B-12 normal Patient Still undergoing continuous EEG in the room today  Possible acute lower UTI: Continue with IV ceftriaxone  , follow urine culture, no growth so far  Hypertensive urgency: PRN IV hydralazine until safe to take p.o.also start patient on clonidine patch  Acute kidney injury versus acute on chronic kidney disease: Presented with creatinine of 1.74. Creatinine down to 1.21.  Briefly hydrated with IV fluids and follow BMP in a.m.  Hyperlipidemia: Not on medications PTA.  LDL 106.  COPD: Stable without clinical bronchospasm.  DM 2: A1c 5.5.  Hold metformin.  SSI.  Chronic left leg ulcer: No acute findings.  Wound care team input appreciated.  History of stroke: Aspirin rectally until safe to take p.o.  Macrocytic anemia and thrombocytopenia: Baseline not known.   Normal vitamin B-12, TSH within normal limits   DVT prophylaxis: Subcutaneous Lovenox. Code Status: Full Family Communication: None at bedside Disposition: patient continues to need inpatient status because of minimal improvement in her symptoms SNF in one to 2 days   Consultants:  Neurology  Procedures:  EEG  Antimicrobials:  Ceftriaxone   Subjective: Patient interviewed and examined with RN at bedside.  Patient is nonverbal moaning /groaning .  Briefly opened eyes spontaneously but not following instructions.  Localizes sternal rub with both upper extremities.  ROS: Unable due to mental status change.    Objective:  Vitals:   06/26/18 2018 06/26/18 2344 06/27/18 0428 06/27/18 0751  BP: (!) 196/73 Marland Kitchen(!)  150/56 (!) 195/79 (!) 180/74  Pulse: 79 81 84 88  Resp: 20 16 18 16   Temp: 97.8 F (36.6 C) 98.2 F (36.8 C) 98.2 F (36.8 C) 98.9 F (37.2 C)  TempSrc: Oral Oral Oral Axillary  SpO2: 94% 98% 96% 97%  Weight:      Height:         Examination:  General exam: Elderly female, moderately built and nourished lying comfortably propped up in bed. Respiratory system: Clear to auscultation/poor inspiratory effort. Respiratory effort normal. Cardiovascular system: S1 & S2 heard, RRR. No JVD, murmurs, rubs, gallops or clicks. No pedal edema. Gastrointestinal system: Abdomen is nondistended, soft and nontender. No organomegaly or masses felt. Normal bowel sounds heard. Central nervous system: Mental status as indicated above.  Grossly nonfocal, unable to participate in exam Extremities: Localized sternal rub with both upper extremities.  Moves both lower extremities symmetrically. Skin: Approximately 3 x 0.5 cm vertically long wound noted on left leg laterally which is scabbed and has no acute findings. Psychiatry: Judgement and insight impaired. Mood & affect cannot be assessed.     Data Reviewed: I have personally reviewed following labs and imaging studies  CBC: Recent Labs  Lab 06/26/18 0126 06/26/18 0132 06/27/18 0411  WBC 9.0  --  9.9  NEUTROABS 5.1  --   --   HGB 11.8* 12.6 12.3  HCT 39.4 37.0 39.0  MCV 107.9*  --  104.3*  PLT 120*  --  142*   Basic Metabolic Panel: Recent Labs  Lab 06/26/18 0126 06/26/18 0132 06/27/18 0411  NA 139 137 140  K 4.8 4.7 4.2  CL 100 101 101  CO2 27  --  27  GLUCOSE 150* 147* 126*  BUN 37* 37* 18  CREATININE 1.74* 1.70* 1.21*  CALCIUM 8.9  --  8.7*   Liver Function Tests: Recent Labs  Lab 06/26/18 0126  AST 14*  ALT 9  ALKPHOS 58  BILITOT 0.7  PROT 6.1*  ALBUMIN 3.2*   Coagulation Profile: Recent Labs  Lab 06/26/18 0126  INR 0.96   CBG: Recent Labs  Lab 06/26/18 1633 06/26/18 2018 06/26/18 2353 06/27/18 0426 06/27/18 0813  GLUCAP 131* 192* 131* 118* 124*    Recent Results (from the past 240 hour(s))  Urine Culture     Status: None   Collection Time: 06/26/18  2:22 AM  Result Value Ref Range Status   Specimen Description URINE, RANDOM   Final   Special Requests ADDED 1610  Final   Culture   Final    NO GROWTH Performed at [facility], [address], [place], [place] [address]    Report Status 06/27/2018 FINAL  Final  MRSA PCR Screening     Status: None   Collection Time: 06/26/18  2:35 PM  Result Value Ref Range Status   MRSA by PCR NEGATIVE NEGATIVE Final    Comment:        The GeneXpert MRSA Assay (FDA approved for NASAL specimens only), is one component of a comprehensive MRSA colonization surveillance program. It is not intended to diagnose MRSA infection nor to guide or monitor treatment for MRSA infections. Performed at [facility], [address] [place], [place] [address]          Radiology Studies: Mr Brain 64 Contrast  Result Date: 06/26/2018 CLINICAL DATA:  84 year old female code stroke, altered mental status, slurred speech, left facial droop. EXAM: MRI HEAD WITHOUT CONTRAST TECHNIQUE: Multiplanar, multiecho pulse sequences of the  brain and surrounding structures were obtained without intravenous contrast. COMPARISON:  Head CT without contrast 0131 hours today. Head face and cervical spine CT 12/01/2017. FINDINGS: Brain: No restricted diffusion or evidence of acute infarction. Extensive T2 heterogeneity in the bilateral deep gray matter nuclei appears to reflect a combination of dilated perivascular spaces and chronic lacunes (right caudate and are thalamus). There is a small chronic lacune in the left paracentral pons. There is a tiny chronic infarct in the right cerebellum (series 7, image 9). Patchy and confluent bilateral cerebral white matter T2 and FLAIR hyperintensity. Occasional chronic micro hemorrhages in the brain, including the left cerebellum on series 9, image 19. No midline shift, mass effect, evidence of mass lesion, ventriculomegaly, extra-axial collection or acute intracranial hemorrhage. Cervicomedullary junction and pituitary are within normal limits. Vascular: Major  intracranial vascular flow voids are preserved. Skull and upper cervical spine: Widespread cervical spine degeneration with mild reversal of lordosis. Hyperostosis of the calvarium. Visible bone marrow signal remains within normal limits. Sinuses/Orbits: Postoperative changes to both globes. Otherwise normal orbits soft tissues. Paranasal sinuses and mastoids are stable and well pneumatized. Other: Grossly normal visible internal auditory structures. Scalp and face soft tissues appear negative. IMPRESSION: 1.  No acute intracranial abnormality. 2. Moderate to advanced signal changes in the brain compatible with chronic small vessel disease. Electronically Signed   By: Odessa Fleming M.D.   On: 06/26/2018 08:42   Dg Chest Portable 1 View  Result Date: 06/26/2018 CLINICAL DATA:  Altered mental status EXAM: PORTABLE CHEST 1 VIEW COMPARISON:  None. FINDINGS: Cardiac shadows within normal limits. Aortic calcifications are seen. The lungs are well aerated bilaterally. No focal infiltrate or sizable effusion is noted. No bony abnormality is seen. IMPRESSION: No active disease. Electronically Signed   By: Alcide Clever M.D.   On: 06/26/2018 01:53   Ct Head Code Stroke Wo Contrast  Result Date: 06/26/2018 CLINICAL DATA:  Code stroke. Left-sided facial droop, right-sided gaze, right dilated pupil. EXAM: CT HEAD WITHOUT CONTRAST TECHNIQUE: Contiguous axial images were obtained from the base of the skull through the vertex without intravenous contrast. COMPARISON:  12/01/2017 CT head FINDINGS: Brain: No evidence of acute infarction, hemorrhage, hydrocephalus, extra-axial collection or mass lesion/mass effect. Advanced chronic microvascular ischemic changes, volume loss of the brain, small chronic infarct in left hemi pons, and multiple small chronic infarcts in the bilateral basal ganglia are stable in comparison with prior CT of the head. Vascular: Calcific atherosclerosis of the carotid siphons and vertebral arteries. No  hyperdense vessel identified. Skull: Normal. Negative for fracture or focal lesion. Sinuses/Orbits: No acute finding. Other: None. ASPECTS [facility] Stroke Program Early CT Score) - Ganglionic level infarction (caudate, lentiform nuclei, internal capsule, insula, M1-M3 cortex): 7 - Supraganglionic infarction (M4-M6 cortex): 3 Total score (0-10 with 10 being normal): 10 IMPRESSION: 1. No acute intracranial abnormality identified. 2. ASPECTS is 10 3. Stable advanced chronic microvascular ischemic changes, volume loss of the brain, and multiple small chronic infarcts in the basal ganglia as well as pons. These results were communicated to Dr. Laurence Slate at 1:40 amon 8/12/2019by text page via the [facility] messaging system. Electronically Signed   By: Mitzi Hansen M.D.   On: 06/26/2018 01:41        Scheduled Meds: . aspirin  300 mg Rectal Daily  . heparin  5,000 Units Subcutaneous Q8H  . insulin aspart  0-9 Units Subcutaneous Q4H  . liver oil-zinc oxide   Topical BID   Continuous Infusions: . cefTRIAXone (ROCEPHIN)  IV    . cefTRIAXone (ROCEPHIN)  IV 1 g (06/26/18 1410)  . levETIRAcetam    . levETIRAcetam       LOS: 1 day     Richarda OverlieNayana Sommer Spickard, MD,   [facility]    If 7PM-7AM, please contact night-coverage [email or web address] Password [email or web address] 06/27/2018, 9:46 AM

## 2018-06-27 NOTE — Evaluation (Signed)
Speech Language Pathology Evaluation Patient Details Name: Judith Turner MRN: [id number] DOB: 02/10/1923 Today's Date: 06/27/2018 Time: [id number] SLP Time Calculation (min) (ACUTE ONLY): 12 min  Problem List:  Patient Active Problem List   Diagnosis Date Noted  . Hypertensive urgency 06/26/2018  . Acute metabolic encephalopathy 06/26/2018  . AKI (acute kidney injury) (HCC) 06/26/2018  . Skin ulcer-left leg 06/26/2018  . UTI (urinary tract infection) 06/26/2018  . COPD (chronic obstructive pulmonary disease) (HCC)   . Diabetes mellitus without complication (HCC)   . Gout   . Stroke (HCC)   . Hyperlipidemia    Past Medical History:  Past Medical History:  Diagnosis Date  . COPD (chronic obstructive pulmonary disease) (HCC)   . Diabetes mellitus without complication (HCC)   . Gout   . Hyperlipidemia   . Hypertension   . Renal disorder    Stage 1 renal failure  . Stroke [facility](HCC)    TIA no residual  deficits   Past Surgical History: History reviewed. No pertinent surgical history. HPI:  84 year old female, SNF resident, PMH of HTN, HLD, DM 2, COPD, stroke, gout, presented to the hospital due to altered mental status/unresponsive, left facial droop, slurred speech.  Pt also found to have UTI.  MRI brain negative for acute finding, CXR negative.  At baseline reportedly able to talk and walk with assistance per review of MD notes.  She was in her usual state on the morning of admission, confused with slurred speech and hallucinations in the afternoon.   Pt diagnosed with metabolic encephalopathy and has undergone EEG.  Pt has been lethargic during admission.     Assessment / Plan / Recommendation Clinical Impression  Pt currently presents with profound cognitive linguistic deficits.  She is fully alert but does not consistently follow one step directions *though she attempts.  Pt is also grossly dysarthric with very poor intelligibility.  She is echolalic, repeating "Head", "Your arm" but  makes no attempts for novel communication.  She was able to acknowledge her name only.   Automatic speech tasks such as counting without pt attempts.  She did however attempt to say "bye" when SLP left room with SLP cue.  Will follow up to establish baseline cognitive linguistic skills from family and maxmize her function to decrease caregiver burden.      SLP Assessment  SLP Recommendation/Assessment: Patient needs continued Speech Lanaguage Pathology Services SLP Visit Diagnosis: Dysarthria and anarthria (R47.1);Cognitive communication deficit (R41.841)    Follow Up Recommendations  (tbd)    Frequency and Duration min 2x/week  1 week      SLP Evaluation Cognition  Overall Cognitive Status: No family/caregiver present to determine baseline cognitive functioning Arousal/Alertness: Awake/alert Orientation Level: Oriented to person;Disoriented to place;Disoriented to time;Disoriented to situation Attention: Focused Focused Attention: Impaired Focused Attention Impairment: Functional basic;Verbal basic Problem Solving: Impaired Problem Solving Impairment: Functional basic(to move arm from under gown) Safety/Judgment: (pt not at this level)       Comprehension  Auditory Comprehension Overall Auditory Comprehension: Impaired Yes/No Questions: Not tested Commands: Impaired One Step Basic Commands: 0-24% accurate Conversation: Simple Visual Recognition/Discrimination Discrimination: Not tested Reading Comprehension Reading Status: Not tested    Expression Expression Primary Mode of Expression: Verbal Verbal Expression Overall Verbal Expression: Impaired Initiation: Impaired Automatic Speech: Social Response;Counting(social response attempt, counting no attempts) Level of Generative/Spontaneous Verbalization: Word(perseverative/echolalic responses) Repetition: Impaired Level of Impairment: Word level(not attempts at repetition) Pragmatics: Unable to assess Interfering  Components: Attention;Speech intelligibility Non-Verbal Means of Communication: Not applicable Written  Expression Dominant Hand: (pt states left handed but chart indicated right) Written Expression: Not tested   Oral / Motor  Oral Motor/Sensory Function Overall Oral Motor/Sensory Function: Generalized oral weakness(pt did not follow directions for oral motor exam, ? ) Facial ROM: Reduced left Facial Symmetry: Abnormal symmetry left Facial Strength: Reduced left Motor Speech Overall Motor Speech: Impaired Respiration: Impaired Phonation: Breathy;Low vocal intensity Articulation: Impaired Level of Impairment: Word Intelligibility: Intelligibility reduced Word: 0-24% accurate(pt grossly dysarthric) Phrase: Not tested Sentence: Not tested Conversation: Not tested Motor Planning: Not tested Motor Speech Errors: Not applicable   GO                    [name], Mena Lienau Ann 06/27/2018, 10:11 AM  Donavan Burnetamara Leith Szafranski, MS [facility] SLP [phone]

## 2018-06-27 NOTE — Progress Notes (Signed)
LTM EEG checked, no skin breakdown noted. Pt had pulled off many electrodes overnight. All have been reapplied. Head wrapped.

## 2018-06-27 NOTE — Progress Notes (Addendum)
Subjective: In pain when touched in any region of body, perseverating.   Exam: Vitals:   06/27/18 0428 06/27/18 0751  BP: (!) 195/79 (!) 180/74  Pulse: 84 88  Resp: 18 16  Temp: 98.2 F (36.8 C) 98.9 F (37.2 C)  SpO2: 96% 97%   Physical Exam   HEENT-  Normocephalic, no lesions, without obvious abnormality.  Normal external eye and conjunctiva.   Extremities- Warm, dry and intact Musculoskeletal-no joint tenderness, deformity or swelling Skin-warm and dry, no hyperpigmentation, vitiligo, or suspicious lesions Neuro:  Mental Status: Alert,perseveration and not following commands.  Cranial Nerves: II:  Blinks to threat left greater than right.  III,IV, VI: ptosis right eyet, extra-ocular motions intact bilaterally pupils equal, round, reactive to light and accommodation V,VII: has flatting of NL on the left, facial light touch sensation and does not like to be touched VIII: hearing normal bilaterally  Motor: Moving UE and legs flexed and will not allow us to extend.  Tone and bulk:normal tone throughout; no atrophy noted Sensory: Pinprick and light touch intact throughout, bilaterally Deep Tendon Reflexes: 2+ and symmetric throughout UE and no AJ or KJ due to bent legs Plantars: Right: downgoing   Left: downgoing   Medications:  Scheduled: . aspirin  300 mg Rectal Daily  . heparin  5,000 Units Subcutaneous Q8H  . insulin aspart  0-9 Units Subcutaneous Q4H  . liver oil-zinc oxide   Topical BID   Continuous: . cefTRIAXone (ROCEPHIN)  IV    . cefTRIAXone (ROCEPHIN)  IV 1 g (06/26/18 1410)  . levETIRAcetam    . levETIRAcetam      Pertinent Labs/Diagnostics: No new labs EEG: SUMMARY: This was a moderately abnormal continuous video EEG due to generalized background slowing and runs of GPDs with triphasic appearance at times. The triphasic appearance of the discharges raises concern for a toxic-metabolic encephalopathy as an etiology, however the GPDs were sharper in  appearance at times as well. Events of arm tremors were seen in waking, however the baseline GPDs were unchanged in appearance during these events.  Felicie MornDavid Koppenhaver PA-C [facility] [phone]  Assessment: 84 YO femal with confusion in setting of UTI. Today now awake but perseverating. EEG shows GPDs.   Concern for underlying electrographic disturbance. Will try AEDs and continue LTM.  Recommendations: -will add Keppra to see if this helps the GPDs.  Loaded with 750 mg IV x1 followed by 500 mg twice daily. -Control blood pressure -Treat UTI -Continue LTM --Do not use Tramadol for pain.  06/27/2018, 8:42 AM  Attending Neurohospitalist Addendum Patient seen and examined with APP/Resident. Agree with the history and physical as documented above. Agree with the plan as documented, which I helped formulate. I have independently reviewed the chart, obtained history, review of systems and examined the patient.I have personally reviewed pertinent head/neck/spine imaging (CT/MRI). Please feel free to call with any questions. --- Milon DikesAshish Jamilia Jacques, MD [facility] Pager: [phone]  If 7pm to 7am, please call on call as listed on AMION.

## 2018-06-28 LAB — GLUCOSE, CAPILLARY
GLUCOSE-CAPILLARY: 102 mg/dL — AB (ref 70–99)
Glucose-Capillary: 104 mg/dL — ABNORMAL HIGH (ref 70–99)
Glucose-Capillary: 111 mg/dL — ABNORMAL HIGH (ref 70–99)
Glucose-Capillary: 113 mg/dL — ABNORMAL HIGH (ref 70–99)
Glucose-Capillary: 125 mg/dL — ABNORMAL HIGH (ref 70–99)

## 2018-06-28 LAB — COMPREHENSIVE METABOLIC PANEL
ALBUMIN: 2.9 g/dL — AB (ref 3.5–5.0)
ALT: 7 U/L (ref 0–44)
ANION GAP: 13 (ref 5–15)
AST: 16 U/L (ref 15–41)
Alkaline Phosphatase: 57 U/L (ref 38–126)
BILIRUBIN TOTAL: 1 mg/dL (ref 0.3–1.2)
BUN: 14 mg/dL (ref 8–23)
CHLORIDE: 105 mmol/L (ref 98–111)
CO2: 25 mmol/L (ref 22–32)
Calcium: 8.7 mg/dL — ABNORMAL LOW (ref 8.9–10.3)
Creatinine, Ser: 1.19 mg/dL — ABNORMAL HIGH (ref 0.44–1.00)
GFR calc Af Amer: 45 mL/min — ABNORMAL LOW (ref >60)
GFR calc non Af Amer: 38 mL/min — ABNORMAL LOW (ref >60)
Glucose, Bld: 107 mg/dL — ABNORMAL HIGH (ref 70–99)
POTASSIUM: 3.9 mmol/L (ref 3.5–5.1)
Sodium: 143 mmol/L (ref 135–145)
TOTAL PROTEIN: 5.8 g/dL — AB (ref 6.5–8.1)

## 2018-06-28 MED ORDER — ASPIRIN 325 MG PO TABS
325.0000 mg | ORAL_TABLET | Freq: Every day | ORAL | Status: DC
  Administered 2018-06-28 – 2018-06-29 (×2): 325 mg via ORAL
  Filled 2018-06-28 (×2): qty 1

## 2018-06-28 NOTE — Progress Notes (Signed)
Pt unable to tolerate EEG and pulled leads off. OK to d/c vEEG. Will re-evaluate in the AM and decide on spot EEG if needed.  -- Milon DikesAshish Tredarius Cobern, MD [facility] Pager: [phone] If 7pm to 7am, please call on call as listed on AMION.

## 2018-06-28 NOTE — Progress Notes (Signed)
[facility] PROGRESS NOTE  Judith Turner ZOX:[id number] DOB: 11/24/1922 DOA: 06/26/2018 PCP: System, Pcp Not In   Narrative: 84 year old female HTN, HLD, COPD, gout-prior  stroke/TIA chronically on aspirin  Fall with basilar skull fracture 12/01/2017-treated in the emergency room sent home SNF resident Last seen normal 8/11-found to have slurred speech?  Pain medication?  Code stroke  Very confused-WBC 9, INR 0.9 troponin negative-urinalysis?  Positive, creatinine 1.3 BUN 37 CXR negative, CT negative but with microvascular disease Neurology consulted   A & Plan Toxic metabolic encephalopathy unclear cause-?  Continued seizure versus UTI-UTI ruled out-neurology loaded patient with Keppra and per my discussion with Dr. Jerrell BelfastAurora is much better after this.  Also received tramadol on admission-defer to neurology if further work-up is necessary- Hyperlipidemia-continue statins when able COPD-stable stage III Urine analysis positive-urine culture negative-discontinuing antibiotics Hypertensive urgency 200/58 on admission-continue clonidine 0.3 hydralazine 5 every 4 as needed AKI stabilizing Left leg skin ulcer wound care consulted DM TY 2 on metformin-appears between 101 113    DVT prophylaxis: Lovenox code Status: Full family Communication: Discussed with daughter at the bedside disposition Plan: Not ready for discharge   Georgio Hattabaugh, MD  [facility] Direct contact: [phone] --Via amion app OR  --[email or web address]; password [email or web address]  7PM-7AM contact night coverage as above 06/28/2018, 7:25 AM  LOS: 2 days   Consultants:  Neurology  Procedures:  MRI brain  Continuous EEG  Antimicrobials:  Ceftriaxone from admission until 8/14  Interval history/Subjective: Wake alert making some sentences now whereas previously was not able to do so  Objective:  Vitals:  Vitals:   06/28/18 0416 06/28/18 0530  BP: (!) 192/79 (!) 147/67  Pulse: 86   Resp: 16   Temp: 97.9 F (36.6  C)   SpO2: 93% 95%    Exam:  . EOMI NCAT looks about stated age poor dentition . Neck is soft supple . Chest is clear . S1-S2 holosystolic murmur . Abdomen soft no rebound no guarding . No lower extremity edema . Psych cannot assess   I have personally reviewed the following:   Labs:  BUN/creatinine 14/1.1, albumin 2.9  Imaging studies:  no  Medical tests:  no   Test discussed with performing physician:  no  Decision to obtain old records:  y  Review and summation of old records:  y  Scheduled Meds: . aspirin  300 mg Rectal Daily  . cloNIDine  0.3 mg Transdermal Weekly  . heparin  5,000 Units Subcutaneous Q8H  . insulin aspart  0-9 Units Subcutaneous Q4H  . liver oil-zinc oxide   Topical BID   Continuous Infusions: . levETIRAcetam 500 mg (06/27/18 2116)    Principal Problem:   Acute metabolic encephalopathy Active Problems:   Hyperlipidemia   COPD (chronic obstructive pulmonary disease) (HCC)   Diabetes mellitus without complication (HCC)   Gout   Stroke (HCC)   Hypertensive urgency   AKI (acute kidney injury) (HCC)   Skin ulcer-left leg   UTI (urinary tract infection)   LOS: 2 days

## 2018-06-28 NOTE — Progress Notes (Signed)
  Speech Language Pathology Treatment: Dysphagia  Patient Details Name: Judith Turner MRN: [id number] DOB: 04/07/1923 Today's Date: 06/28/2018 Time: 0715-0728 SLP Time Calculation (min) (ACUTE ONLY): 13 min  Assessment / Plan / Recommendation Clinical Impression  Pt with much improved mentation today.  She does not follow directions consistently this am and required reorientation x2 during session.  Pt willing to accept only minimal po intake this am but did not demonstrate any clinical indications of airway compromise.  She did not follow directions for 3 ounce Yale water test.  Slow mastication with cracker noted but no oral residuals present (viewing as much as pt would allow SLP to view).    Pt became mildly verbally agitated with SLP stating "no" frequently to po offered and saying "no it's not" when SLP advised she was ok.  She is disoriented stating Judith JacobsonHelen was calling this SLP.    Recommend dys3/thin diet with strict precautions.  SLP to follow up for cognitive linguistic and swallow treatment/assessment.     HPI HPI: 84 year old female, SNF resident, PMH of HTN, HLD, DM 2, COPD, stroke, gout, presented to the hospital due to altered mental status/unresponsive, left facial droop, slurred speech.  Pt also found to have UTI.  MRI brain negative for acute finding, CXR negative.  At baseline reportedly able to talk and walk with assistance per review of MD notes.  She was in her usual state on the morning of admission, confused with slurred speech and hallucinations in the afternoon.   Pt diagnosed with metabolic encephalopathy and has undergone EEG.  Pt has been lethargic during admission.        SLP Plan  Continue with current plan of care       Recommendations  Diet recommendations: Dysphagia 3 (mechanical soft);Thin liquid Liquids provided via: Cup;Straw Medication Administration: Whole meds with puree Supervision: Patient able to self feed;Staff to assist with self  feeding Compensations: Slow rate;Small sips/bites Postural Changes and/or Swallow Maneuvers: Upright 30-60 min after meal;Seated upright 90 degrees                Oral Care Recommendations: Oral care BID Follow up Recommendations: (tbd) SLP Visit Diagnosis: Dysarthria and anarthria (R47.1);Cognitive communication deficit (O13.086(R41.841) Plan: Continue with current plan of care       GO               Judith Burnetamara Chemere Steffler, MS [facility] SLP [phone]  Judith AbrahamsKimball, Judith Turner 06/28/2018, 7:43 AM

## 2018-06-28 NOTE — Progress Notes (Addendum)
Subjective: No complaints at this time  Exam: Vitals:   06/28/18 0530 06/28/18 0700  BP: (!) 147/67 (!) 145/71  Pulse:  91  Resp:  18  Temp:  97.8 F (36.6 C)  SpO2: 95% 95%    Physical Exam   HEENT-  Normocephalic, no lesions, without obvious abnormality.  Normal external eye and conjunctiva.   Extremities- Warm, dry and intact Musculoskeletal-no joint tenderness, deformity or swelling Skin-warm and dry, no hyperpigmentation, vitiligo, or suspicious lesions Neuro:  Mental Status: Awakens to voice.  Able to say her name however she is not oriented but able to name objects such as her nails, fingers, knuckles.  Still has mild perseveration.  When asked to move her legs or arms she is noncooperative and says I do not want to. Cranial Nerves: II: Blinks to threat left greater than right III,IV, VI: Continues to have ptosis in the right eye, extra-ocular motions intact bilaterally pupils equal, round, reactive to light and accommodation V,VII: Left nasolabial fold flattening, facial light touch sensation normal bilaterally VIII: hearing normal bilaterally Motor: Moving upper extremities antigravity spontaneously and purposefully, attempted to test lower extremities but quickly would not allow us to. Sensory: Pinprick and light touch intact throughout, bilaterally Deep Tendon Reflexes: Able to get deep tendon reflexes as patient would not let us touch her.     Medications:  Scheduled: . aspirin  325 mg Oral Daily  . cloNIDine  0.3 mg Transdermal Weekly  . heparin  5,000 Units Subcutaneous Q8H  . insulin aspart  0-9 Units Subcutaneous Q4H  . liver oil-zinc oxide   Topical BID   Continuous: . levETIRAcetam 500 mg (06/28/18 0849)    Pertinent Labs/Diagnostics: No significant changes in her CMP  EEG/LTM: SUMMARY: This wasa mildly abnormal continuous video EEG due to resolving GPDs and some mild background slowing, indicative of a resolving encephalopathy pattern. The GPDs  were not apparent after 1900 (unclear if medications were administered around this time as event button not pressed).   Felicie MornDavid Wedge PA-C [facility] [phone]   Assessment: 84 year old female with confusion in the setting of UTI.  Today she is able to name objects and follow some commands.  She still has mild perseverating.  EEG shows resolving GPDS-likely due to the addition of Keppra.  Recommendations: -Continue Keppra at 500 BID -Control blood pressure  -treatment of  UTI per primary team -Do not use tramadol for pain -Monitor on EEG for one more day and if GPDs continue to be absent, can d/c EEG then. Will follow.  06/28/2018, 9:53 AM  Attending Neurohospitalist Addendum Patient seen and examined with APP/Resident. Agree with the history and physical as documented above. Agree with the plan as documented, which I helped formulate. I have independently reviewed the chart, obtained history, review of systems and examined the patient.I have personally reviewed pertinent head/neck/spine imaging (CT/MRI). Please feel free to call with any questions. --- Milon DikesAshish Adelise Buswell, MD [facility] Pager: [phone]  If 7pm to 7am, please call on call as listed on AMION.

## 2018-06-28 NOTE — Procedures (Signed)
LTM-EEG Report  HISTORY: Continuous video-EEG monitoring performed for 84 year old with tremors, intermittent confusion, UTI. ACQUISITION: International 10-20 system for electrode placement; 18 channels with additional eyes linked to ipsilateral ears and EKG. Additional T1-T2 electrodes were used. Continuous video recording obtained.   EEG NUMBER:  MEDICATIONS:  Day 2: see EMR  DAY #2: from 0730 06/27/18 to 0730 06/28/18 BACKGROUND: An overall medium voltage continuous recording with some spontaneous variability and reactivity. The background consisted of medium voltage, semirhythmic theta and some delta activity bilaterally with improved superimposed faster frequencies. There was emergence of a 8-9Hz  posterior dominant rhythm. State changes were seen with some normal stage II sleep architecture. EPILEPTIFORM/PERIODIC ACTIVITY: There were resolving GPDs with blunted, triphasic appearance, markedly improved after 1900. These were not seen following this period and showed no ictal evolution.  SEIZURES: none EVENTS: none  EKG: no significant arrhythmia  SUMMARY: This was a mildly abnormal continuous video EEG due to resolving GPDs and some mild background slowing, indicative of a resolving encephalopathy pattern. The GPDs were not apparent after 1900 (unclear if medications were administered around this time as event button not pressed).

## 2018-06-28 NOTE — NC FL2 (Signed)
[place] MEDICAID FL2 LEVEL OF CARE SCREENING TOOL     IDENTIFICATION  Patient Name: Judith Turner Birthdate: 05/12/1923 Sex: female Admission Date (Current Location): 06/26/2018  [facility] [place]   Facility and Address:  [facility] [place], [address], [place], [place] [address]      Provider Number: [id number]  Attending Physician Name and Address:  Rhetta MuraSamtani, Jai-Gurmukh, MD  Relative Name and Phone Number:       Current Level of Care: Hospital Recommended Level of Care: Skilled Nursing Facility Prior Approval Number:    Date Approved/Denied:   PASRR Number:    Discharge Plan: SNF    Current Diagnoses: Patient Active Problem List   Diagnosis Date Noted  . Hypertensive urgency 06/26/2018  . Acute metabolic encephalopathy 06/26/2018  . AKI (acute kidney injury) (HCC) 06/26/2018  . Skin ulcer-left leg 06/26/2018  . UTI (urinary tract infection) 06/26/2018  . COPD (chronic obstructive pulmonary disease) (HCC)   . Diabetes mellitus without complication (HCC)   . Gout   . Stroke (HCC)   . Hyperlipidemia     Orientation RESPIRATION BLADDER Height & Weight     Self  Normal Incontinent Weight: 155 lb 13.8 oz (70.7 kg) Height:  5\' 4"  (162.6 cm)  BEHAVIORAL SYMPTOMS/MOOD NEUROLOGICAL BOWEL NUTRITION STATUS      Continent Diet(mechanical soft)  AMBULATORY STATUS COMMUNICATION OF NEEDS Skin   Extensive Assist Verbally PU Stage and Appropriate Care, Other (Comment)(open wound, left tibial with foam dressing changed PRN) PU Stage 1 Dressing: (on sacrum, foam dressing changed PRN)                     Personal Care Assistance Level of Assistance  Bathing, Feeding, Dressing Bathing Assistance: Maximum assistance Feeding assistance: Limited assistance Dressing Assistance: Maximum assistance     Functional Limitations Info  Sight, Hearing, Speech Sight Info: Adequate Hearing Info: Adequate Speech Info: Impaired     SPECIAL CARE FACTORS FREQUENCY  PT (By licensed PT), OT (By licensed OT), Speech therapy     PT Frequency: 5x/wk OT Frequency: 5x/wk     Speech Therapy Frequency: 5x/wk      Contractures Contractures Info: Not present    Additional Factors Info  Code Status, Allergies, Insulin Sliding Scale Code Status Info: Full Allergies Info: NKA   Insulin Sliding Scale Info: 0-9 units every 4 hours       Current Medications (06/28/2018):  This is the current hospital active medication list Current Facility-Administered Medications  Medication Dose Route Frequency Provider Last Rate Last Dose  . acetaminophen (TYLENOL) suppository 650 mg  650 mg Rectal Q6H PRN Hongalgi, Anand D, MD      . albuterol (PROVENTIL) (2.5 MG/3ML) 0.083% nebulizer solution 2.5 mg  2.5 mg Nebulization Q4H PRN Lorretta HarpNiu, Xilin, MD      . aspirin tablet 325 mg  325 mg Oral Daily Samtani, Jai-Gurmukh, MD      . cloNIDine (CATAPRES - Dosed in mg/24 hr) patch 0.3 mg  0.3 mg Transdermal Weekly Richarda OverlieAbrol, Nayana, MD   0.3 mg at 06/27/18 1718  . heparin injection 5,000 Units  5,000 Units Subcutaneous Q8H Lorretta HarpNiu, Xilin, MD   5,000 Units at 06/28/18 0600  . hydrALAZINE (APRESOLINE) injection 5 mg  5 mg Intravenous Q4H PRN Elease EtienneHongalgi, Anand D, MD   5 mg at 06/28/18 0500  . insulin aspart (novoLOG) injection 0-9 Units  0-9 Units Subcutaneous Q4H Hongalgi, Maximino GreenlandAnand D, MD   1 Units at  06/27/18 2118  . levETIRAcetam (KEPPRA) IVPB 500 mg/100 mL premix  500 mg Intravenous Q12H Ulice DashSmith, David R, PA-C 400 mL/hr at 06/28/18 0849 500 mg at 06/28/18 0849  . liver oil-zinc oxide (DESITIN) 40 % ointment   Topical BID Hongalgi, Anand D, MD      . LORazepam (ATIVAN) injection 1 mg  1 mg Intravenous Q2H PRN Lorretta HarpNiu, Xilin, MD      . ondansetron [facility](ZOFRAN) injection 4 mg  4 mg Intravenous Q8H PRN Lorretta HarpNiu, Xilin, MD         Discharge Medications: Please see discharge summary for a list of discharge medications.  Relevant Imaging Results:  Relevant Lab  Results:   Additional Information SS#: [id number]  Baldemar LenisElizabeth M Jais Demir, LCSW

## 2018-06-28 NOTE — Progress Notes (Signed)
Pt is refusing to eat. RN has offered ensure's, boost, ice cream different types of snacks and pt refuses to eat anything. PT is only drinking water. Will continue to attempt.

## 2018-06-28 NOTE — Progress Notes (Signed)
EEG discontinued per Dr. Wilford CornerArora - pt very agitated and does not tolerate electrodes.

## 2018-06-28 NOTE — Progress Notes (Signed)
CSW following for discharge planning. [facility] [place], and they are able to take her back whenever stable. If patient would still be appropriate for rehabilitation services when she becomes more alert and able to participate, CSW will attempt to obtain insurance authorization [facility].  CSW to follow and complete full assessment with patient and daughter when more appropriate.  Blenda NicelyElizabeth Lavance Beazer, [place] Clinical Social Worker [phone]

## 2018-06-29 LAB — GLUCOSE, CAPILLARY
GLUCOSE-CAPILLARY: 112 mg/dL — AB (ref 70–99)
GLUCOSE-CAPILLARY: 110 mg/dL — AB (ref 70–99)
GLUCOSE-CAPILLARY: 137 mg/dL — AB (ref 70–99)
Glucose-Capillary: 94 mg/dL (ref 70–99)

## 2018-06-29 MED ORDER — LEVETIRACETAM 500 MG PO TABS: 500.0000 mg | ORAL_TABLET | Freq: Two times a day (BID) | ORAL | 0 refills | Status: AC

## 2018-06-29 NOTE — Progress Notes (Signed)
[facility] Anticipated discharge date:  06/29/18 Family notified: Ignacia MarvelYes, Brenda, by phone Transportation by: [facility]  Report #: [phone], Room 59  CSW signing off.  Blenda Nicelylizabeth Miamarie Moll LCSW [phone]

## 2018-06-29 NOTE — Progress Notes (Addendum)
Subjective: Patient significantly improved today, sitting up and eating breakfast.  Still slightly confused about where she is.  She no longer refuses to allow us to examine her and is agitated when exam is taken.  No complaints at this time.  Exam: Vitals:   06/29/18 0417 06/29/18 0745  BP: (!) 158/76 (!) 164/78  Pulse: 100 97  Resp: 20 18  Temp:  98.1 F (36.7 C)  SpO2: 94% 95%    Physical Exam   HEENT-  Normocephalic, no lesions, without obvious abnormality.  Normal external eye and conjunctiva.   Extremities- Warm, dry and intact Musculoskeletal-no joint tenderness, deformity or swelling Skin-warm and dry, no hyperpigmentation, vitiligo, or suspicious lesions  Neuro:  Mental Status: Alert, oriented to self but not to place or time.  Speech fluent without evidence of aphasia.  Able to follow simple commands without difficulty. Cranial Nerves: II:  Visual fields grossly normal,  III,IV, VI: ptosis has improved and shows no ptosis in the right eye, extra-ocular motions intact bilaterally pupils equal, round, reactive to light and accommodation V,VII: smile symmetric, facial light touch sensation normal bilaterally VIII: hearing normal bilaterally Motor: Moving all extremities antigravity Sensory:  light touch intact throughout, bilaterally  Medications:  Scheduled: . aspirin  325 mg Oral Daily  . cloNIDine  0.3 mg Transdermal Weekly  . heparin  5,000 Units Subcutaneous Q8H  . insulin aspart  0-9 Units Subcutaneous Q4H  . liver oil-zinc oxide   Topical BID   Continuous: . levETIRAcetam 500 mg (06/28/18 2203)   Pertinent Labs/Diagnostics: Unable to tolerate EEG and pull leads off twice. No new labs at this point in time  Felicie MornDavid Azimi PA-C [facility] [phone]   Assessment: 84 year old female presenting with UTI, confusion and perseveration.  EEG was obtained and showed GPDS.  Keppra was initiated and showed decreased GPDS.  Patient is extremely  improved today with the initiation of Keppra.  At this point most likely etiology is sick encephalopathy secondary to UTI resulting in lower seizure threshold causing GPDS which resolved after the addition of Keppra.  Recommendations: -Continue Keppra at current dose - Follow-up with neurology as an outpatient after discharged-4 to 6 weeks -Maintain seizure precautions - Treat underlying toxic metabolic derangements as you are. Please call neurology with questions.  06/29/2018, 8:32 AM  Attending Neurohospitalist Addendum Patient seen and examined with APP/Resident. Agree with the history and physical as documented above. Agree with the plan as documented, which I helped formulate. I have independently reviewed the chart, obtained history, review of systems and examined the patient.I have personally reviewed pertinent head/neck/spine imaging (CT/MRI). Please feel free to call with any questions. --- Milon DikesAshish Jolyne Laye, MD [facility] Pager: [phone]  If 7pm to 7am, please call on call as listed on AMION.  \

## 2018-06-29 NOTE — Progress Notes (Signed)
  Transitions of Care Polypharmacy Medication Deprescribing Review  Judith Turner is a 84 y.o. female admitted for AMS on 06/26/2018.   The patient's PTA medications and comorbidities were reviewed to identify opportunities for medication optimization through deprescribing.   Short-Term Recommendations: . Discontinue phenazopyridine. . Consider whether daily vitamin supplementation necessary or could be discontinued.   Long-Term Recommendations: . N/A   Patient Assessment:  . Extent of Polypharmacy: 14 PTA medications, hyperpolypharmacy (10+ meds) o Associated risk: moderate o Number of home Rx meds: 8  o Number of home OTC meds: 6 . Comorbidity Polypharmacy Score: 20 o Associated risk: severe . Charlson Comorbidity Index: 6 o Associated risk: severe-morbid (2% estimated 10-year survival) . GerontoNet Adverse Drug Event Risk Score: 6 o Associated risk: moderate (~12% risk ADE)  Overall risk: moderate; hyperpolypharmacy from OTC meds, charlson comorbidity index driven by age, ADE risk better than expected at current age     Medication Assessment:  . Potentially Inappropriate Medications (PIMs)  o High risk medications: 0 o Moderate risk medications: 1 - Tramadol  o Low risk medications: 0 . Medications Associated with Geriatric Syndromes (MAGS): 5 o Amlodipine, metoprolol tartrate, senna, tramadol, metformin  Overall risk: low to moderate; not many PIMs identified, on lower doses of BP meds   The patient was interviewed to determine her interest in making medication changes. Shared decision-making was used to determine the optimal medications to deprescribe.   Patient Deprescribing Readiness: . Patient Opinions About Deprescribing survey score: 11 o Score of 4-8: negative patient opinion of deprescribing; deprescribing likely to be unsuccessful o Score of 9-11: intermediate patient opinion of deprescribing; deprescribing may be successful o Score of 12-16: positive  patient opinion of deprescribing; deprescribing likely to be successful   Shared Decision-Making Discussion:  . Patient was very confused and only oriented to self; able to answer deprescribing survey questions but unable to have a productive conversation about medications. . Patient indicated desire to do whatever the doctor thought was important.   Please see above for final recommendations.  Greer PickerelJessica Davonte Siebenaler, Student-PharmD 06/29/2018 2:26 PM

## 2018-06-29 NOTE — Progress Notes (Signed)
Physical Therapy Treatment Patient Details Name: Judith Turner Foot MRN: [id number] DOB: 06/09/1923 Today's Date: 06/29/2018    History of Present Illness 84 y.o. female with medical history significant of hypertension, hyperlipidemia, diabetes mellitus, COPD, stroke, gout, who presents with altered mental status    PT Comments    Patient is pleasant and oriented to person only this session. Pt required mod/max A for all mobility and +2 for safety with OOB mobility. Pt c/o L LE pain (posterior distal wound) and with antalgic gait pattern. Pt tolerated short distance gait with LOB requiring max A to recover. Continue to progress as tolerated with anticipated d/c to SNF for further skilled PT services.     Follow Up Recommendations  SNF;Supervision/Assistance - 24 hour     Equipment Recommendations  None recommended by PT    Recommendations for Other Services       Precautions / Restrictions Precautions Precautions: Fall    Mobility  Bed Mobility Overal bed mobility: Needs Assistance Bed Mobility: Supine to Sit     Supine to sit: Mod assist     General bed mobility comments: pt requires multimodal cues for sequencing and assistanct to bring hips to EOB and elevate trunk all the way into sitting   Transfers Overall transfer level: Needs assistance Equipment used: Rolling walker (2 wheeled) Transfers: Sit to/from Stand Sit to Stand: +2 physical assistance;Mod assist         General transfer comment: assistance to power up into standing and to gain balance upon standing due to posterior bias; cues for safe hand placement on RW   Ambulation/Gait Ambulation/Gait assistance: Mod assist;+2 safety/equipment Gait Distance (Feet): (2412ft X2) Assistive device: Rolling walker (2 wheeled) Gait Pattern/deviations: Step-to pattern;Decreased step length - right;Shuffle;Trunk flexed;Antalgic;Decreased stance time - left     General Gait Details: assistance required for balance, weight  shifting, and guiding RW; pt with LOB and required max A to recover   Stairs             Wheelchair Mobility    Modified Rankin (Stroke Patients Only) Modified Rankin (Stroke Patients Only) Pre-Morbid Rankin Score: Moderately severe disability Modified Rankin: Moderately severe disability     Balance Overall balance assessment: Needs assistance Sitting-balance support: Feet supported Sitting balance-Leahy Scale: Fair     Standing balance support: Bilateral upper extremity supported;During functional activity Standing balance-Leahy Scale: Poor                              Cognition Arousal/Alertness: Awake/alert Behavior During Therapy: Flat affect Overall Cognitive Status: No family/caregiver present to determine baseline cognitive functioning Area of Impairment: Orientation;Attention;Memory;Following commands;Safety/judgement;Awareness;Problem solving                 Orientation Level: Disoriented to;Place;Time;Situation Current Attention Level: Focused Memory: Decreased short-term memory Following Commands: Follows one step commands with increased time;Follows one step commands inconsistently Safety/Judgement: Decreased awareness of safety;Decreased awareness of deficits Awareness: Intellectual Problem Solving: Slow processing;Decreased initiation;Requires verbal cues;Difficulty sequencing General Comments: pt pleasant and tangential throughout session      Exercises      General Comments        Pertinent Vitals/Pain Pain Assessment: Faces Faces Pain Scale: Hurts little more Pain Location: posterior distal L LE (wound with bandage) Pain Descriptors / Indicators: Grimacing;Guarding;Sore Pain Intervention(s): Monitored during session;Repositioned    Home Living  Prior Function            PT Goals (current goals can now be found in the care plan section) Acute Rehab PT Goals Patient Stated Goal: none  stated PT Goal Formulation: Patient unable to participate in goal setting Time For Goal Achievement: 07/10/18 Potential to Achieve Goals: Fair Progress towards PT goals: Progressing toward goals    Frequency    Min 2X/week      PT Plan Current plan remains appropriate    Co-evaluation              AM-PAC PT "6 Clicks" Daily Activity  Outcome Measure  Difficulty turning over in bed (including adjusting bedclothes, sheets and blankets)?: A Lot Difficulty moving from lying on back to sitting on the side of the bed? : Unable Difficulty sitting down on and standing up from a chair with arms (e.g., wheelchair, bedside commode, etc,.)?: Unable Help needed moving to and from a bed to chair (including a wheelchair)?: A Lot Help needed walking in hospital room?: A Lot Help needed climbing 3-5 steps with a railing? : Total 6 Click Score: 9    End of Session Equipment Utilized During Treatment: Gait belt Activity Tolerance: Patient tolerated treatment well Patient left: with call bell/phone within reach;in chair;with chair alarm set Nurse Communication: Mobility status PT Visit Diagnosis: Other symptoms and signs involving the nervous system (R29.898);Muscle weakness (generalized) (M62.81)     Time: [id number] PT Time Calculation (min) (ACUTE ONLY): 28 min  Charges:  $Gait Training: 8-22 mins $Therapeutic Activity: 8-22 mins                     Erline LevineKellyn Ritika Hellickson, PTA Pager: [phone]     Carolynne EdouardKellyn R Fahed Morten 06/29/2018, 11:09 AM

## 2018-06-29 NOTE — Progress Notes (Addendum)
  Speech Language Pathology Treatment: Dysphagia  Patient Details Name: Judith Turner MRN: [id number] DOB: 04/02/1923 Today's Date: 06/29/2018 Time: [id number] SLP Time Calculation (min) (ACUTE ONLY): 15 min  Assessment / Plan / Recommendation Clinical Impression  Pt fully alert today and self feeding - Documentation indicates she consumed 100 percent of her meals.  SLP noted pt with food retained on right labial region with delayed responses to clear.  She is impulsive and takes large boluses sequentially without clearing first.  With min cues, pt able to adequately clear orally with delay.  Pt tends to be loquacious and will talk instead of focusing on consuming meals, thus recommend life restrictions to intermittent supervision.  Advised NT to recommendations and informed pt of recommendations.    Today pt is disoriented but her speech is intelligible.  She verbalizes that she had a "man ask to take her home last night" but she stated she "could not date a black man".  Pt is disoriented to place, ? How much of this is baseline.  Follow up at SNF recommended for swallow/language/cognitive treatment.  Pt left with towels on her table to provide distraction to keep her calm.  Thanks.     HPI HPI: 84 year old female, SNF resident, PMH of HTN, HLD, DM 2, COPD, stroke, gout, presented to the hospital due to altered mental status/unresponsive, left facial droop, slurred speech.  Pt also found to have UTI.  MRI brain negative for acute finding, CXR negative.  At baseline reportedly able to talk and walk with assistance per review of MD notes.  She was in her usual state on the morning of admission, confused with slurred speech and hallucinations in the afternoon.   Pt diagnosed with metabolic encephalopathy and has undergone EEG.  Pt has been lethargic during admission.  Pt started on diet yesterday after significant improvement noted with mentation.        SLP Plan  Continue with current plan of care        Recommendations  Diet recommendations: Dysphagia 3 (mechanical soft);Thin liquid Liquids provided via: Straw Medication Administration: Whole meds with puree Supervision: Patient able to self feed;Staff to assist with self feeding Compensations: Slow rate;Small sips/bites;Lingual sweep for clearance of pocketing Postural Changes and/or Swallow Maneuvers: Upright 30-60 min after meal;Seated upright 90 degrees                Oral Care Recommendations: Oral care BID Follow up Recommendations: (tbd) SLP Visit Diagnosis: Dysarthria and anarthria (R47.1);Cognitive communication deficit (I69.629(R41.841) Plan: Continue with current plan of care       GO                [name], [name] 06/29/2018, 10:01 AM  [name], MS [facility] SLP [phone]

## 2018-06-29 NOTE — Discharge Summary (Addendum)
Physician Discharge Summary  Judith Turner ZOX:[id number] DOB: 15-Oct-1923 DOA: 06/26/2018  PCP: System, Pcp Not In  Admit date: 06/26/2018 Discharge date: 06/29/2018  Time spent: 25 minutes  Recommendations for Outpatient Follow-up:  1. Please continue to monitor for seizure-she was started on Keppra 500 twice daily and given a prescription for this 2. Needs neurology follow up in 4-6 weeks--kindly arrange 3. Screen for dementia as an outpatient 4. Needs basic metabolic panel 1 week and CBC 1 week  Discharge Diagnoses:  Principal Problem:   Acute metabolic encephalopathy Active Problems:   Hyperlipidemia   COPD (chronic obstructive pulmonary disease) (HCC)   Diabetes mellitus without complication (HCC)   Gout   Stroke (HCC)   Hypertensive urgency   AKI (acute kidney injury) (HCC)   Skin ulcer-left leg   UTI (urinary tract infection)   Discharge Condition: Guarded  Diet recommendation: Heart healthy diabetic-Dysphagia iii thin liquid  Filed Weights   06/26/18 0503  Weight: 70.7 kg    History of present illness:  84 year old female with hypertension hyperlipidemia COPD gout prior CVA/TIA with prior basilar skull fracture 12/01/2017 Admitted 8/11 with slurred speech code stroke called initially WBC 9 urinalysis?  Positive creatinine 1.3 BUN 37 CT negative Neurology consult code stroke felt this was toxic metabolic encephalopathy on discharge   Hospital Course:  Toxic metabolic encephalopathy-stroke ruled out-UTI was also ruled out unclear cause for precipitation of the same-in either event underwent long-term EEG and felt that they were GPDS to monitor-it was also felt that the patient would require Keppra long-term so prescription of the same was given  Frequent UTIs-note that patient is on Pyridium at facility-would not use this continuously but would screen for feasibility of suppressive therapy which I have not started-this can be discussed with skilled facility and may be  urologist  Gold stage B2 C-stable  Hypertensive urgency was started on clonidine this admission and she was slightly tachycardic secondary to beta-blocker withdrawal which was renewed  Left leg skin ulcer stable at this time outpatient management  Diabetes mellitus type 2 on metformin and renewed as an outpatient  Procedures: LT EEG    Consultations:  NEurology  Discharge Exam: Vitals:   06/29/18 0417 06/29/18 0745  BP: (!) 158/76 (!) 164/78  Pulse: 100 97  Resp: 20 18  Temp:  98.1 F (36.7 C)  SpO2: 94% 95%    General: Awake alert pleasant oriented no distress Cardiovascular: S1-S2 no murmur rub or gallop Respiratory: Chest clinically clear It does not appear that she has thrush she has a dry mouth with some crusting on the upper lip she has a mild squint/strabismus of the right eye extraocular movements are intact she is much clearer with regards to speech power is 5/5 she follows commands appropriately does not know the date but knows a place to some degree has some cognitive slowing  Discharge Instructions   Discharge Instructions    Diet - low sodium heart healthy   Complete by:  As directed    Increase activity slowly   Complete by:  As directed      Allergies as of 06/29/2018   No Known Allergies     Medication List    STOP taking these medications   bacitracin ointment   phenazopyridine 100 MG tablet Commonly known as:  PYRIDIUM   traMADol 50 MG tablet Commonly known as:  ULTRAM     TAKE these medications   acetaminophen 325 MG tablet Commonly known as:  TYLENOL Take 650 mg  by mouth every 4 (four) hours as needed.   albuterol 108 (90 Base) MCG/ACT inhaler Commonly known as:  PROVENTIL HFA;VENTOLIN HFA Inhale into the lungs 4 (four) times daily as needed for wheezing or shortness of breath.   allopurinol 100 MG tablet Commonly known as:  ZYLOPRIM Take 100 mg by mouth daily.   amLODipine 5 MG tablet Commonly known as:  NORVASC Take 5 mg  by mouth daily.   aspirin 325 MG tablet Take 325 mg by mouth daily.   cholecalciferol 1000 units tablet Commonly known as:  VITAMIN D Take 1,000 Units by mouth daily.   levETIRAcetam 500 MG tablet Commonly known as:  KEPPRA Take 1 tablet (500 mg total) by mouth 2 (two) times daily.   magnesium oxide 400 MG tablet Commonly known as:  MAG-OX Take 400 mg by mouth 2 (two) times daily.   metFORMIN 500 MG tablet Commonly known as:  GLUCOPHAGE Take 500 mg by mouth 3 (three) times daily.   metoprolol tartrate 25 MG tablet Commonly known as:  LOPRESSOR Take 25 mg by mouth daily.   RISA-BID PROBIOTIC Tabs Take 1 tablet by mouth daily.   senna 8.6 MG Tabs tablet Commonly known as:  SENOKOT Take 1 tablet by mouth daily as needed for mild constipation.   vitamin B-12 500 MCG tablet Commonly known as:  CYANOCOBALAMIN Take 500 mcg by mouth daily.      No Known Allergies    The results of significant diagnostics from this hospitalization (including imaging, microbiology, ancillary and laboratory) are listed below for reference.    Significant Diagnostic Studies: Mr Brain 51 Contrast  Result Date: 06/26/2018 CLINICAL DATA:  84 year old female code stroke, altered mental status, slurred speech, left facial droop. EXAM: MRI HEAD WITHOUT CONTRAST TECHNIQUE: Multiplanar, multiecho pulse sequences of the brain and surrounding structures were obtained without intravenous contrast. COMPARISON:  Head CT without contrast 0131 hours today. Head face and cervical spine CT 12/01/2017. FINDINGS: Brain: No restricted diffusion or evidence of acute infarction. Extensive T2 heterogeneity in the bilateral deep gray matter nuclei appears to reflect a combination of dilated perivascular spaces and chronic lacunes (right caudate and are thalamus). There is a small chronic lacune in the left paracentral pons. There is a tiny chronic infarct in the right cerebellum (series 7, image 9). Patchy and confluent  bilateral cerebral white matter T2 and FLAIR hyperintensity. Occasional chronic micro hemorrhages in the brain, including the left cerebellum on series 9, image 19. No midline shift, mass effect, evidence of mass lesion, ventriculomegaly, extra-axial collection or acute intracranial hemorrhage. Cervicomedullary junction and pituitary are within normal limits. Vascular: Major intracranial vascular flow voids are preserved. Skull and upper cervical spine: Widespread cervical spine degeneration with mild reversal of lordosis. Hyperostosis of the calvarium. Visible bone marrow signal remains within normal limits. Sinuses/Orbits: Postoperative changes to both globes. Otherwise normal orbits soft tissues. Paranasal sinuses and mastoids are stable and well pneumatized. Other: Grossly normal visible internal auditory structures. Scalp and face soft tissues appear negative. IMPRESSION: 1.  No acute intracranial abnormality. 2. Moderate to advanced signal changes in the brain compatible with chronic small vessel disease. Electronically Signed   By: Odessa Fleming M.D.   On: 06/26/2018 08:42   Dg Chest Portable 1 View  Result Date: 06/26/2018 CLINICAL DATA:  Altered mental status EXAM: PORTABLE CHEST 1 VIEW COMPARISON:  None. FINDINGS: Cardiac shadows within normal limits. Aortic calcifications are seen. The lungs are well aerated bilaterally. No focal infiltrate or sizable effusion is  noted. No bony abnormality is seen. IMPRESSION: No active disease. Electronically Signed   By: Alcide CleverMark  Lukens M.D.   On: 06/26/2018 01:53   Ct Head Code Stroke Wo Contrast  Result Date: 06/26/2018 CLINICAL DATA:  Code stroke. Left-sided facial droop, right-sided gaze, right dilated pupil. EXAM: CT HEAD WITHOUT CONTRAST TECHNIQUE: Contiguous axial images were obtained from the base of the skull through the vertex without intravenous contrast. COMPARISON:  12/01/2017 CT head FINDINGS: Brain: No evidence of acute infarction, hemorrhage,  hydrocephalus, extra-axial collection or mass lesion/mass effect. Advanced chronic microvascular ischemic changes, volume loss of the brain, small chronic infarct in left hemi pons, and multiple small chronic infarcts in the bilateral basal ganglia are stable in comparison with prior CT of the head. Vascular: Calcific atherosclerosis of the carotid siphons and vertebral arteries. No hyperdense vessel identified. Skull: Normal. Negative for fracture or focal lesion. Sinuses/Orbits: No acute finding. Other: None. ASPECTS [facility]([place] Stroke Program Early CT Score) - Ganglionic level infarction (caudate, lentiform nuclei, internal capsule, insula, M1-M3 cortex): 7 - Supraganglionic infarction (M4-M6 cortex): 3 Total score (0-10 with 10 being normal): 10 IMPRESSION: 1. No acute intracranial abnormality identified. 2. ASPECTS is 10 3. Stable advanced chronic microvascular ischemic changes, volume loss of the brain, and multiple small chronic infarcts in the basal ganglia as well as pons. These results were communicated to Dr. Laurence SlateAroor at 1:40 amon 8/12/2019by text page via the [facility] messaging system. Electronically Signed   By: Mitzi HansenLance  Furusawa-Stratton M.D.   On: 06/26/2018 01:41    Microbiology: Recent Results (from the past 240 hour(s))  Urine Culture     Status: None   Collection Time: 06/26/18  2:22 AM  Result Value Ref Range Status   Specimen Description URINE, RANDOM  Final   Special Requests ADDED [id number]  Final   Culture   Final    NO GROWTH Performed at [facility], [address] [place], [place] [address]    Report Status 06/27/2018 FINAL  Final  Culture, blood (Routine X 2) w Reflex to ID Panel     Status: None (Preliminary result)   Collection Time: 06/26/18  3:33 AM  Result Value Ref Range Status   Specimen Description BLOOD RIGHT ANTECUBITAL  Final   Special Requests   Final    BOTTLES DRAWN AEROBIC AND ANAEROBIC Blood Culture results may not be optimal due to an excessive volume of blood  received in culture bottles   Culture   Final    NO GROWTH 2 DAYS Performed at [facility], [address] [place], [place] [address]    Report Status PENDING  Incomplete  Culture, blood (Routine X 2) w Reflex to ID Panel     Status: None (Preliminary result)   Collection Time: 06/26/18  3:40 AM  Result Value Ref Range Status   Specimen Description BLOOD LEFT ANTECUBITAL  Final   Special Requests   Final    BOTTLES DRAWN AEROBIC AND ANAEROBIC Blood Culture results may not be optimal due to an excessive volume of blood received in culture bottles   Culture   Final    NO GROWTH 2 DAYS Performed at [facility], [address] [place], [place] [address]    Report Status PENDING  Incomplete  MRSA PCR Screening     Status: None   Collection Time: 06/26/18  2:35 PM  Result Value Ref Range Status   MRSA by PCR NEGATIVE NEGATIVE Final    Comment:  The GeneXpert MRSA Assay (FDA approved for NASAL specimens only), is one component of a comprehensive MRSA colonization surveillance program. It is not intended to diagnose MRSA infection nor to guide or monitor treatment for MRSA infections. Performed at [facility], [address] [place], [place] [address]      Labs: Basic Metabolic Panel: Recent Labs  Lab 06/26/18 0126 06/26/18 0132 06/27/18 0411 06/28/18 0516  NA 139 137 140 143  K 4.8 4.7 4.2 3.9  CL 100 101 101 105  CO2 27  --  27 25  GLUCOSE 150* 147* 126* 107*  BUN 37* 37* 18 14  CREATININE 1.74* 1.70* 1.21* 1.19*  CALCIUM 8.9  --  8.7* 8.7*   Liver Function Tests: Recent Labs  Lab 06/26/18 0126 06/28/18 0516  AST 14* 16  ALT 9 7  ALKPHOS 58 57  BILITOT 0.7 1.0  PROT 6.1* 5.8*  ALBUMIN 3.2* 2.9*   No results for input(s): LIPASE, AMYLASE in the last 168 hours. Recent Labs  Lab 06/26/18 0957  AMMONIA 15   CBC: Recent Labs  Lab 06/26/18 0126 06/26/18 0132 06/27/18 0411  WBC 9.0  --  9.9  NEUTROABS 5.1  --   --   HGB  11.8* 12.6 12.3  HCT 39.4 37.0 39.0  MCV 107.9*  --  104.3*  PLT 120*  --  142*   Cardiac Enzymes: No results for input(s): CKTOTAL, CKMB, CKMBINDEX, TROPONINI in the last 168 hours. BNP: BNP (last 3 results) No results for input(s): BNP in the last 8760 hours.  ProBNP (last 3 results) No results for input(s): PROBNP in the last 8760 hours.  CBG: Recent Labs  Lab 06/28/18 1759 06/28/18 2035 06/28/18 2339 06/29/18 0338 06/29/18 0744  GLUCAP 111* 125* 113* 112* 110*       Signed:  Rhetta MuraJai-Gurmukh Ottilie Wigglesworth MD   [facility] 06/29/2018, 10:11 AM

## 2018-06-29 NOTE — Care Management Important Message (Signed)
Important Message  Patient Details  Name: Judith Turner MRN: [id number] Date of Birth: 01/15/1923   Medicare Important Message Given:  Yes    Sereniti Wan 06/29/2018, 4:09 PM

## 2018-07-01 LAB — CULTURE, BLOOD (ROUTINE X 2)
CULTURE: NO GROWTH
Culture: NO GROWTH

## 2018-07-18 ENCOUNTER — Emergency Department (HOSPITAL_COMMUNITY): Payer: Medicare Other

## 2018-07-18 ENCOUNTER — Encounter (HOSPITAL_COMMUNITY): Payer: Self-pay

## 2018-07-18 ENCOUNTER — Other Ambulatory Visit: Payer: Self-pay

## 2018-07-18 ENCOUNTER — Inpatient Hospital Stay (HOSPITAL_COMMUNITY)
Admission: EM | Admit: 2018-07-18 | Discharge: 2018-07-22 | DRG: 689 | Disposition: A | Discharge status: Skilled Nursing Facility | Payer: Medicare Other | Source: Skilled Nursing Facility | Attending: Internal Medicine | Admitting: Internal Medicine

## 2018-07-18 ENCOUNTER — Inpatient Hospital Stay (HOSPITAL_COMMUNITY): Payer: Medicare Other

## 2018-07-18 DIAGNOSIS — Z7984 Long term (current) use of oral hypoglycemic drugs: Secondary | ICD-10-CM | POA: Diagnosis not present

## 2018-07-18 DIAGNOSIS — E1122 Type 2 diabetes mellitus with diabetic chronic kidney disease: Secondary | ICD-10-CM | POA: Diagnosis present

## 2018-07-18 DIAGNOSIS — R4182 Altered mental status, unspecified: Secondary | ICD-10-CM | POA: Diagnosis present

## 2018-07-18 DIAGNOSIS — R400 Somnolence: Secondary | ICD-10-CM | POA: Diagnosis not present

## 2018-07-18 DIAGNOSIS — G40909 Epilepsy, unspecified, not intractable, without status epilepticus: Secondary | ICD-10-CM | POA: Diagnosis present

## 2018-07-18 DIAGNOSIS — Z66 Do not resuscitate: Secondary | ICD-10-CM | POA: Diagnosis present

## 2018-07-18 DIAGNOSIS — L98492 Non-pressure chronic ulcer of skin of other sites with fat layer exposed: Secondary | ICD-10-CM | POA: Diagnosis not present

## 2018-07-18 DIAGNOSIS — E11622 Type 2 diabetes mellitus with other skin ulcer: Secondary | ICD-10-CM | POA: Diagnosis present

## 2018-07-18 DIAGNOSIS — L98499 Non-pressure chronic ulcer of skin of other sites with unspecified severity: Secondary | ICD-10-CM | POA: Diagnosis present

## 2018-07-18 DIAGNOSIS — E785 Hyperlipidemia, unspecified: Secondary | ICD-10-CM | POA: Diagnosis present

## 2018-07-18 DIAGNOSIS — E860 Dehydration: Secondary | ICD-10-CM | POA: Diagnosis present

## 2018-07-18 DIAGNOSIS — N390 Urinary tract infection, site not specified: Secondary | ICD-10-CM | POA: Diagnosis present

## 2018-07-18 DIAGNOSIS — N181 Chronic kidney disease, stage 1: Secondary | ICD-10-CM | POA: Diagnosis present

## 2018-07-18 DIAGNOSIS — M109 Gout, unspecified: Secondary | ICD-10-CM | POA: Diagnosis present

## 2018-07-18 DIAGNOSIS — R1310 Dysphagia, unspecified: Secondary | ICD-10-CM | POA: Diagnosis present

## 2018-07-18 DIAGNOSIS — Z8249 Family history of ischemic heart disease and other diseases of the circulatory system: Secondary | ICD-10-CM | POA: Diagnosis not present

## 2018-07-18 DIAGNOSIS — Z720 Tobacco use: Secondary | ICD-10-CM | POA: Diagnosis not present

## 2018-07-18 DIAGNOSIS — I10 Essential (primary) hypertension: Secondary | ICD-10-CM

## 2018-07-18 DIAGNOSIS — G9340 Encephalopathy, unspecified: Secondary | ICD-10-CM | POA: Diagnosis present

## 2018-07-18 DIAGNOSIS — N179 Acute kidney failure, unspecified: Secondary | ICD-10-CM | POA: Diagnosis present

## 2018-07-18 DIAGNOSIS — G92 Toxic encephalopathy: Secondary | ICD-10-CM | POA: Diagnosis present

## 2018-07-18 DIAGNOSIS — R402423 Glasgow coma scale score 9-12, at hospital admission: Secondary | ICD-10-CM

## 2018-07-18 DIAGNOSIS — Z79899 Other long term (current) drug therapy: Secondary | ICD-10-CM

## 2018-07-18 DIAGNOSIS — Z7982 Long term (current) use of aspirin: Secondary | ICD-10-CM

## 2018-07-18 DIAGNOSIS — B9620 Unspecified Escherichia coli [E. coli] as the cause of diseases classified elsewhere: Secondary | ICD-10-CM | POA: Diagnosis present

## 2018-07-18 DIAGNOSIS — F0390 Unspecified dementia without behavioral disturbance: Secondary | ICD-10-CM | POA: Diagnosis present

## 2018-07-18 DIAGNOSIS — J449 Chronic obstructive pulmonary disease, unspecified: Secondary | ICD-10-CM | POA: Diagnosis present

## 2018-07-18 DIAGNOSIS — Z8673 Personal history of transient ischemic attack (TIA), and cerebral infarction without residual deficits: Secondary | ICD-10-CM | POA: Diagnosis not present

## 2018-07-18 DIAGNOSIS — L03116 Cellulitis of left lower limb: Secondary | ICD-10-CM | POA: Diagnosis not present

## 2018-07-18 DIAGNOSIS — N170 Acute kidney failure with tubular necrosis: Secondary | ICD-10-CM | POA: Diagnosis not present

## 2018-07-18 DIAGNOSIS — E119 Type 2 diabetes mellitus without complications: Secondary | ICD-10-CM | POA: Diagnosis not present

## 2018-07-18 DIAGNOSIS — I129 Hypertensive chronic kidney disease with stage 1 through stage 4 chronic kidney disease, or unspecified chronic kidney disease: Secondary | ICD-10-CM | POA: Diagnosis present

## 2018-07-18 LAB — CBC WITH DIFFERENTIAL/PLATELET
Abs Immature Granulocytes: 0 10*3/uL (ref 0.0–0.1)
Basophils Absolute: 0 10*3/uL (ref 0.0–0.1)
Basophils Relative: 0 %
EOS ABS: 0.4 10*3/uL (ref 0.0–0.7)
EOS PCT: 4 %
HEMATOCRIT: 36.4 % (ref 36.0–46.0)
Hemoglobin: 11 g/dL — ABNORMAL LOW (ref 12.0–15.0)
Immature Granulocytes: 0 %
Lymphocytes Relative: 23 %
Lymphs Abs: 2.2 10*3/uL (ref 0.7–4.0)
MCH: 33.2 pg (ref 26.0–34.0)
MCHC: 30.2 g/dL (ref 30.0–36.0)
MCV: 110 fL — AB (ref 78.0–100.0)
MONO ABS: 0.5 10*3/uL (ref 0.1–1.0)
MONOS PCT: 6 %
Neutro Abs: 6.3 10*3/uL (ref 1.7–7.7)
Neutrophils Relative %: 67 %
Platelets: 118 10*3/uL — ABNORMAL LOW (ref 150–400)
RBC: 3.31 MIL/uL — ABNORMAL LOW (ref 3.87–5.11)
RDW: 14.4 % (ref 11.5–15.5)
WBC: 9.5 10*3/uL (ref 4.0–10.5)

## 2018-07-18 LAB — URINALYSIS, ROUTINE W REFLEX MICROSCOPIC
BILIRUBIN URINE: NEGATIVE
Glucose, UA: NEGATIVE mg/dL
Ketones, ur: NEGATIVE mg/dL
Nitrite: NEGATIVE
PH: 5 (ref 5.0–8.0)
Protein, ur: NEGATIVE mg/dL
SPECIFIC GRAVITY, URINE: 1.019 (ref 1.005–1.030)

## 2018-07-18 LAB — COMPREHENSIVE METABOLIC PANEL
ALT: 9 U/L (ref 0–44)
ANION GAP: 10 (ref 5–15)
AST: 13 U/L — AB (ref 15–41)
Albumin: 2.8 g/dL — ABNORMAL LOW (ref 3.5–5.0)
Alkaline Phosphatase: 60 U/L (ref 38–126)
BILIRUBIN TOTAL: 0.6 mg/dL (ref 0.3–1.2)
BUN: 32 mg/dL — AB (ref 8–23)
CO2: 26 mmol/L (ref 22–32)
Calcium: 8.7 mg/dL — ABNORMAL LOW (ref 8.9–10.3)
Chloride: 105 mmol/L (ref 98–111)
Creatinine, Ser: 1.72 mg/dL — ABNORMAL HIGH (ref 0.44–1.00)
GFR calc Af Amer: 29 mL/min — ABNORMAL LOW (ref >60)
GFR, EST NON AFRICAN AMERICAN: 25 mL/min — AB (ref >60)
Glucose, Bld: 107 mg/dL — ABNORMAL HIGH (ref 70–99)
POTASSIUM: 5.4 mmol/L — AB (ref 3.5–5.1)
Sodium: 141 mmol/L (ref 135–145)
TOTAL PROTEIN: 5.5 g/dL — AB (ref 6.5–8.1)

## 2018-07-18 LAB — POTASSIUM: Potassium: 5.4 mmol/L — ABNORMAL HIGH (ref 3.5–5.1)

## 2018-07-18 LAB — I-STAT TROPONIN, ED: TROPONIN I, POC: 0.03 ng/mL (ref 0.00–0.08)

## 2018-07-18 LAB — CBG MONITORING, ED: Glucose-Capillary: 104 mg/dL — ABNORMAL HIGH (ref 70–99)

## 2018-07-18 LAB — AMMONIA: Ammonia: 20 umol/L (ref 9–35)

## 2018-07-18 LAB — I-STAT CG4 LACTIC ACID, ED: Lactic Acid, Venous: 1.68 mmol/L (ref 0.5–1.9)

## 2018-07-18 MED ORDER — DOCUSATE SODIUM 100 MG PO CAPS
100.0000 mg | ORAL_CAPSULE | Freq: Two times a day (BID) | ORAL | Status: DC
  Administered 2018-07-20 – 2018-07-22 (×4): 100 mg via ORAL
  Filled 2018-07-18 (×5): qty 1

## 2018-07-18 MED ORDER — METOPROLOL TARTRATE 25 MG PO TABS
25.0000 mg | ORAL_TABLET | Freq: Every day | ORAL | Status: DC
  Administered 2018-07-20 – 2018-07-22 (×3): 25 mg via ORAL
  Filled 2018-07-18 (×3): qty 1

## 2018-07-18 MED ORDER — ONDANSETRON HCL 4 MG/2ML IJ SOLN: 4.0000 mg | Freq: Four times a day (QID) | INTRAMUSCULAR | Status: DC | PRN

## 2018-07-18 MED ORDER — ACETAMINOPHEN 325 MG PO TABS: 650.0000 mg | ORAL_TABLET | Freq: Four times a day (QID) | ORAL | Status: DC | PRN

## 2018-07-18 MED ORDER — ALLOPURINOL 100 MG PO TABS
100.0000 mg | ORAL_TABLET | Freq: Every day | ORAL | Status: DC
  Administered 2018-07-20 – 2018-07-22 (×3): 100 mg via ORAL
  Filled 2018-07-18 (×3): qty 1

## 2018-07-18 MED ORDER — ASPIRIN 325 MG PO TABS
325.0000 mg | ORAL_TABLET | Freq: Every day | ORAL | Status: DC
  Administered 2018-07-20 – 2018-07-22 (×3): 325 mg via ORAL
  Filled 2018-07-18 (×3): qty 1

## 2018-07-18 MED ORDER — ACETAMINOPHEN 650 MG RE SUPP: 650.0000 mg | Freq: Four times a day (QID) | RECTAL | Status: DC | PRN

## 2018-07-18 MED ORDER — ENOXAPARIN SODIUM 30 MG/0.3ML ~~LOC~~ SOLN
30.0000 mg | SUBCUTANEOUS | Status: DC
  Administered 2018-07-18 – 2018-07-21 (×4): 30 mg via SUBCUTANEOUS
  Filled 2018-07-18 (×4): qty 0.3

## 2018-07-18 MED ORDER — ONDANSETRON HCL 4 MG PO TABS: 4.0000 mg | ORAL_TABLET | Freq: Four times a day (QID) | ORAL | Status: DC | PRN

## 2018-07-18 MED ORDER — LEVETIRACETAM IN NACL 500 MG/100ML IV SOLN
500.0000 mg | Freq: Two times a day (BID) | INTRAVENOUS | Status: DC
  Administered 2018-07-18 – 2018-07-22 (×8): 500 mg via INTRAVENOUS
  Filled 2018-07-18 (×11): qty 100

## 2018-07-18 MED ORDER — SODIUM CHLORIDE 0.9 % IV BOLUS
500.0000 mL | Freq: Once | INTRAVENOUS | Status: AC
  Administered 2018-07-18: 500 mL via INTRAVENOUS

## 2018-07-18 MED ORDER — ENOXAPARIN SODIUM 40 MG/0.4ML ~~LOC~~ SOLN: 40.0000 mg | SUBCUTANEOUS | Status: DC

## 2018-07-18 MED ORDER — LACTATED RINGERS IV SOLN
INTRAVENOUS | Status: DC
  Administered 2018-07-18 – 2018-07-19 (×2): via INTRAVENOUS

## 2018-07-18 MED ORDER — HYDROCODONE-ACETAMINOPHEN 5-325 MG PO TABS
1.0000 | ORAL_TABLET | ORAL | Status: DC | PRN
  Administered 2018-07-21: 1 via ORAL
  Filled 2018-07-18 (×2): qty 1

## 2018-07-18 MED ORDER — CEFAZOLIN SODIUM-DEXTROSE 1-4 GM/50ML-% IV SOLN
1.0000 g | Freq: Once | INTRAVENOUS | Status: AC
  Administered 2018-07-18: 1 g via INTRAVENOUS
  Filled 2018-07-18: qty 50

## 2018-07-18 NOTE — H&P (Signed)
History and Physical    Judith Turner ZOX:[id number] DOB: Mar 15, 1923 DOA: 07/18/2018  PCP: System, Pcp Not In  Patient coming from: [facility]; NOK: Daughter, Steward Drone, [phone]  Chief Complaint: AMS  HPI: Judith Turner is a 84 y.o. female with medical history significant of CVA; CKD; HTN; HLD; DM; and COPD presenting with AMS.  She was unaccompanied at the time of my evaluation.  She was admitted for basically the same issue from 8/12-15.  I called and spoke with her daughter.  Her daughter reports similar presentation during prior admission - seizure activity at that time, and she was started on anti-seizure medication and released back.  She has had gradual progression, although her "cognitive abilities were greatly impaired."  She had a very good Saturday with PT.  This AM, she was back to being unresponsive.  She doesn't have dementia but she gets confused very easily.  As long as things don't change and she has routine, she was fine; however, if anything disrupted her routine she would get very confused.  She did not recognize her facility upon return from the hospital last time - this improved with time.  Her daughter reports that she does have a DNR.    ED Course: Encephalopathy, uncertain cause.  Possibly related to LE wound.  Normal at 9pm - ambulatory with walker and conversational but confused.  She did not want to get up this AM and later was "unresponsive" per family.  During transport, she opened her eyes to pain.  Now, will open eyes to voice and intermittently responds to commands.  Low-mid 90s on O2 sats but no respiratory symptoms.  No complaints of recent illness.  Review of Systems: Unable to assess  Ambulatory Status:  Ambulates with a walker  Past Medical History:  Diagnosis Date  . COPD (chronic obstructive pulmonary disease) (HCC)   . Diabetes mellitus without complication (HCC)   . Gout   . Hyperlipidemia   . Hypertension   . Renal disorder    Stage 1 renal  failure  . Stroke [facility])    TIA no residual  deficits    History reviewed. No pertinent surgical history.  Social History   Socioeconomic History  . Marital status: Widowed    Spouse name: Not on file  . Number of children: Not on file  . Years of education: Not on file  . Highest education level: Not on file  Occupational History  . Not on file  Social Needs  . Financial resource strain: Not on file  . Food insecurity:    Worry: Not on file    Inability: Not on file  . Transportation needs:    Medical: Not on file    Non-medical: Not on file  Tobacco Use  . Smoking status: Never Smoker  . Smokeless tobacco: Current User  Substance and Sexual Activity  . Alcohol use: No    Frequency: Never  . Drug use: No  . Sexual activity: Never  Lifestyle  . Physical activity:    Days per week: Not on file    Minutes per session: Not on file  . Stress: Not on file  Relationships  . Social connections:    Talks on phone: Not on file    Gets together: Not on file    Attends religious service: Not on file    Active member of club or organization: Not on file    Attends meetings of clubs or organizations: Not on file    Relationship status:  Not on file  . Intimate partner violence:    Fear of current or ex partner: Not on file    Emotionally abused: Not on file    Physically abused: Not on file    Forced sexual activity: Not on file  Other Topics Concern  . Not on file  Social History Narrative  . Not on file    No Known Allergies  Family History  Problem Relation Age of Onset  . Hypertension Mother   . Hypertension Father     Prior to Admission medications   Medication Sig Start Date End Date Taking? Authorizing Provider  acetaminophen (TYLENOL) 325 MG tablet Take 650 mg by mouth every 4 (four) hours as needed.   Yes [provider]  albuterol (PROVENTIL HFA;VENTOLIN HFA) 108 (90 Base) MCG/ACT inhaler Inhale into the lungs 4 (four) times daily as needed for  wheezing or shortness of breath.   Yes [provider]  allopurinol (ZYLOPRIM) 100 MG tablet Take 100 mg by mouth daily.   Yes [provider]  aspirin 325 MG tablet Take 325 mg by mouth daily.   Yes [provider]  cholecalciferol (VITAMIN D) 1000 units tablet Take 1,000 Units by mouth daily.   Yes [provider]  HYDROcodone-acetaminophen (NORCO/VICODIN) 5-325 MG tablet Take 1 tablet by mouth every 4 (four) hours as needed for severe pain.   Yes [provider]  ibuprofen (ADVIL,MOTRIN) 200 MG tablet Take 200 mg by mouth every 6 (six) hours as needed for mild pain.   Yes [provider]  levETIRAcetam (KEPPRA) 500 MG tablet Take 1 tablet (500 mg total) by mouth 2 (two) times daily. 06/29/18  Yes Rhetta Mura, MD  magnesium oxide (MAG-OX) 400 MG tablet Take 400 mg by mouth 2 (two) times daily.   Yes [provider]  metFORMIN (GLUCOPHAGE) 500 MG tablet Take 500 mg by mouth daily.    Yes [provider]  metoprolol tartrate (LOPRESSOR) 25 MG tablet Take 25 mg by mouth daily.    Yes [provider]  Probiotic Product (RISA-BID PROBIOTIC) TABS Take 1 tablet by mouth daily.   Yes [provider]  senna (SENOKOT) 8.6 MG TABS tablet Take 1 tablet by mouth daily as needed for mild constipation.   Yes [provider]    Physical Exam: Vitals:   07/18/18 1030 07/18/18 1130 07/18/18 1230 07/18/18 1338  BP: (!) 140/38 (!) 117/55 (!) 145/59 (!) 143/56  Pulse: 60 (!) 58 (!) 57 (!) 57  Resp: 18 16 16 16   Temp:    98.1 F (36.7 C)  TempSrc:    Axillary  SpO2: 98% 100% 100% 96%  Weight:    69.7 kg  Height:    5\' 4"  (1.626 m)     General: Obtunded, GCS 9 Eyes:  Wound not open eyes during evaluation ENT: grossly normal hearing, lips & tongue, mmm Neck:  no LAD, masses or thyromegaly; no carotid bruits Cardiovascular:  RRR, no m/r/g. No LE edema.  Respiratory:   CTA bilaterally with no  wheezes/rales/rhonchi.  Normal respiratory effort. Abdomen:  soft, NT, ND, NABS Skin:  Excoriated ulcerative lesion on left posterior calf without significant surrounding edema or erythema     Musculoskeletal: grossly normal tone BUE/BLE, good passive ROM, no bony abnormality Psychiatric: obtunded, would not open eyes, but responded to commands Neurologic: Unable to perform   Radiological Exams on Admission: Ct Head Wo Contrast  Result Date: 07/18/2018 CLINICAL DATA:  Altered level of consciousness,  unexplained. Patient is now unresponsive. Recent UTI. EXAM: CT HEAD WITHOUT CONTRAST TECHNIQUE: Contiguous axial images were obtained from the base of the skull through the vertex without intravenous contrast. COMPARISON:  MRI brain 06/26/2018. CT head without contrast 06/26/2018. FINDINGS: Brain: Advanced atrophy and diffuse white matter changes are again seen. Remote lacunar infarcts of the basal ganglia are stable. No acute infarct, hemorrhage, or mass lesion is present. The ventricles are proportionate to the degree of atrophy. Brainstem and cerebellum within limits. Vascular: Atherosclerotic calcifications are present in the cavernous internal carotid arteries bilaterally and at the dural margin of both vertebral arteries. There is no hyperdense vessel. Skull: Calvarium is intact. Hyperostosis is noted. No focal lytic or blastic lesions are present. No significant extracranial soft tissue lesions are present. Sinuses/Orbits: The paranasal sinuses and mastoid air cells are clear. Bilateral lens replacements are present. Globes and orbits are otherwise within limits. IMPRESSION: 1. No acute intracranial abnormality. 2. Stable advanced atrophy and diffuse white matter disease. This likely reflects the sequela of chronic microvascular ischemia. 3. Stable remote lacunar infarcts of the basal ganglia bilaterally. 4. Atherosclerosis. Electronically Signed   By: Marin Roberts M.D.   On: 07/18/2018 11:00    Dg Chest Portable 1 View  Result Date: 07/18/2018 CLINICAL DATA:  Altered mental status EXAM: PORTABLE CHEST 1 VIEW COMPARISON:  June 26, 2018 FINDINGS: There is no edema or consolidation. Heart is upper normal in size with pulmonary vascularity normal. There is aortic atherosclerosis. No evident adenopathy. No bone lesions. IMPRESSION: Aortic atherosclerosis. No edema or consolidation. Stable cardiac silhouette. Aortic Atherosclerosis (ICD10-I70.0). Electronically Signed   By: Bretta Bang III M.D.   On: 07/18/2018 10:43    EKG: Independently reviewed.  NSR with rate 63; nonspecific ST changes with no evidence of acute ischemia   Labs on Admission: I have personally reviewed the available labs and imaging studies at the time of the admission.  Pertinent labs:   K+ 5.4 Glucose 107 BUN 32/Creatinine 1.72/GFR 25; 14/1.19/38 on 8/14 Albumin 2.8 Troponin 0.03 Lactate 1.68 WBC 9.5 Hgb 11.0 Platelets 118 UA: small Hgb, trace LE, rare bacteria Blood and urine cultures pending  Assessment/Plan Principal Problem:   Altered mental status Active Problems:   Diabetes mellitus without complication (HCC)   Skin ulcer-left leg   Essential hypertension   Acute renal failure (ARF) (HCC)   AMS -Patient with similar presentation about 2 weeks ago, no source found -There was some thought that her global slowing alternating with periods of attenuation was related to seizure activity -She was started on Keppra and seemed to have some improvement -Current presentation is very similar -Suspect that this is terminal dementia based on H&P and review of prior notes -Will continue looking for reversible causes of encephalopathy -Neuro consult for possible repeat EEG -Stroke swallow screen prior to anything PO -Daughter appears to be realistic - although did not recognize that patient has dementia and thought she was just easily confused  Acute renal failure -She does have evidence of renal  failure, and this may well be contributing to current AMS -Likely due to prerenal failure secondary to dehydration in the setting of decreased PO intake and possibly NSAID use -IVF repletion -Follow up renal function by BMP -Avoid NSAIDs and other nephrotoxic drugs  Skin ulcer -The ulceration on the back of her leg is likely exacerbated by immobility -Wound care by nursing -Wound care consult -It does not appear to be actively infected at this time  HTN -Continue Lopressor  DM -hold Glucophage -Will not cover with SSI at this time due to low concern for short-or long-term complications of poorly controlled DM   DVT prophylaxis: Lovenox  Code Status: DNR - confirmed with family  Family Communication: Spoke with daughter by telephone Disposition Plan:   Back to SNF once clinically improved Consults called: Neurology, wound care Admission status: Admit - It is my clinical opinion that admission to INPATIENT is reasonable and necessary because of the expectation that this patient will require hospital care that crosses at least 2 midnights to treat this condition based on the medical complexity of the problems presented.  Given the aforementioned information, the predictability of an adverse outcome is felt to be significant.    Jonah Blue MD [facility]  If note is complete, please contact covering daytime or nighttime physician. [email or web address] Password [facility]  07/18/2018, 6:37 PM

## 2018-07-18 NOTE — Progress Notes (Signed)
EEG completed; results pending.    

## 2018-07-18 NOTE — Procedures (Signed)
History: 84 year old female with confusion  Sedation: None  Technique: This is a 21 channel routine scalp EEG performed at the bedside with bipolar and monopolar montages arranged in accordance to the international 10/20 system of electrode placement. One channel was dedicated to EKG recording.    Background: There is significant slowing of background rhythms.  The posterior dominant rhythm achieves a maximal frequency of 7 Hz and there is generalized irregular delta intermixed throughout.  There are no epileptiform or periodic discharges.  Photic stimulation: Physiologic driving is not performed  EEG Abnormalities: 1) generalized irregular slow activity 2) slow posterior dominant rhythm  Clinical Interpretation: This normal EEG is recorded in the waking and drowsy state. There was no seizure or seizure predisposition recorded on this study. Please note that a normal EEG does not preclude the possibility of epilepsy.   Ritta Slot, MD [facility] [phone]  If 7pm- 7am, please page neurology on call as listed in AMION.

## 2018-07-18 NOTE — ED Triage Notes (Signed)
Pt biba for c/o AMS ; last known well time was yesterday ; per staff pt is usually alert and talkative; upon EMS arrival; pt was unresponsive and not following simple commands ; pt recently treated for UTI and left foot wound infection ; pt alert but not following simple commands at this time

## 2018-07-18 NOTE — ED Provider Notes (Addendum)
[facility] EMERGENCY DEPARTMENT Provider Note   CSN: [id number] Arrival date & time: 07/18/18  2130     History   Chief Complaint Chief Complaint  Patient presents with  . Altered Mental Status    HPI Judith Turner is a 84 y.o. female.  Level 5 caveat due to altered mental status.  HPI   Judith Turner is a 84 y.o. female, with a history of COPD, DM, HTN, presenting to the ED from [facility] with altered mental status.  EMS reports patient last seen normal last night, patient usually alert and talkative.  Unresponsive upon EMS arrival, but became responsive to at least pain in route to the hospital.  Patient indicates her left lower leg pain is the only complaint she currently has.    Past Medical History:  Diagnosis Date  . COPD (chronic obstructive pulmonary disease) (HCC)   . Diabetes mellitus without complication (HCC)   . Gout   . Hyperlipidemia   . Hypertension   . Renal disorder    Stage 1 renal failure  . Stroke [facility])    TIA no residual  deficits    Patient Active Problem List   Diagnosis Date Noted  . Altered mental status 07/18/2018  . Hypertensive urgency 06/26/2018  . Acute metabolic encephalopathy 06/26/2018  . AKI (acute kidney injury) (HCC) 06/26/2018  . Skin ulcer-left leg 06/26/2018  . UTI (urinary tract infection) 06/26/2018  . COPD (chronic obstructive pulmonary disease) (HCC)   . Diabetes mellitus without complication (HCC)   . Gout   . Stroke (HCC)   . Hyperlipidemia     History reviewed. No pertinent surgical history.   OB History   None      Home Medications    Prior to Admission medications   Medication Sig Start Date End Date Taking? Authorizing Provider  acetaminophen (TYLENOL) 325 MG tablet Take 650 mg by mouth every 4 (four) hours as needed.   Yes [provider]  albuterol (PROVENTIL HFA;VENTOLIN HFA) 108 (90 Base) MCG/ACT inhaler Inhale into the lungs 4 (four) times daily as needed for  wheezing or shortness of breath.   Yes [provider]  allopurinol (ZYLOPRIM) 100 MG tablet Take 100 mg by mouth daily.   Yes [provider]  aspirin 325 MG tablet Take 325 mg by mouth daily.   Yes [provider]  cholecalciferol (VITAMIN D) 1000 units tablet Take 1,000 Units by mouth daily.   Yes [provider]  HYDROcodone-acetaminophen (NORCO/VICODIN) 5-325 MG tablet Take 1 tablet by mouth every 4 (four) hours as needed for severe pain.   Yes [provider]  ibuprofen (ADVIL,MOTRIN) 200 MG tablet Take 200 mg by mouth every 6 (six) hours as needed for mild pain.   Yes [provider]  levETIRAcetam (KEPPRA) 500 MG tablet Take 1 tablet (500 mg total) by mouth 2 (two) times daily. 06/29/18  Yes Rhetta Mura, MD  magnesium oxide (MAG-OX) 400 MG tablet Take 400 mg by mouth 2 (two) times daily.   Yes [provider]  metFORMIN (GLUCOPHAGE) 500 MG tablet Take 500 mg by mouth daily.    Yes [provider]  metoprolol tartrate (LOPRESSOR) 25 MG tablet Take 25 mg by mouth daily.    Yes [provider]  Probiotic Product (RISA-BID PROBIOTIC) TABS Take 1 tablet by mouth daily.   Yes [provider]  senna (SENOKOT) 8.6 MG TABS tablet Take 1 tablet by mouth daily as needed for mild constipation.  Yes [provider]    Family History No family history on file.  Social History Social History   Tobacco Use  . Smoking status: Never Smoker  . Smokeless tobacco: Current User  Substance Use Topics  . Alcohol use: No    Frequency: Never  . Drug use: No     Allergies   Patient has no known allergies.   Review of Systems Review of Systems  Unable to perform ROS: Mental status change     Physical Exam Updated Vital Signs BP (!) 122/51   Pulse 66   Temp 98.6 F (37 C) (Axillary)   Resp 16   Ht 5\' 4"  (1.626 m)   Wt 70 kg   SpO2 97%   BMI 26.49 kg/m   Physical Exam    Constitutional: She appears well-developed and well-nourished. No distress.  HENT:  Head: Normocephalic and atraumatic.  Eyes: Pupils are equal, round, and reactive to light. Conjunctivae are normal.  Neck: Neck supple.  Cardiovascular: Normal rate, regular rhythm, normal heart sounds and intact distal pulses.  Pulmonary/Chest: Effort normal and breath sounds normal. No respiratory distress.  No noted increased work of breathing.  SPO2 at 98% on 2 L supplemental O2.  Abdominal: Soft. There is no tenderness. There is no guarding.  Musculoskeletal: She exhibits edema and tenderness.  Patient has a purulent appearing wound to the posterior left lower leg.  Surrounding erythema and increased warmth.  Lymphadenopathy:    She has no cervical adenopathy.  Neurological:  Eye-opening to voice.  Patient will sometimes mumble responses to questions asked. She has motor function intact in each of her extremities. She would not follow commands.  Skin: Skin is warm and dry. Capillary refill takes less than 2 seconds. She is not diaphoretic.  Psychiatric: She has a normal mood and affect. Her behavior is normal.  Nursing note and vitals reviewed.          ED Treatments / Results  Labs (all labs ordered are listed, but only abnormal results are displayed) Labs Reviewed  CBC WITH DIFFERENTIAL/PLATELET - Abnormal; Notable for the following components:      Result Value   RBC 3.31 (*)    Hemoglobin 11.0 (*)    MCV 110.0 (*)    All other components within normal limits  COMPREHENSIVE METABOLIC PANEL - Abnormal; Notable for the following components:   Potassium 5.4 (*)    Glucose, Bld 107 (*)    BUN 32 (*)    Creatinine, Ser 1.72 (*)    Calcium 8.7 (*)    Total Protein 5.5 (*)    Albumin 2.8 (*)    AST 13 (*)    GFR calc non Af Amer 25 (*)    GFR calc Af Amer 29 (*)    All other components within normal limits  URINALYSIS, ROUTINE W REFLEX MICROSCOPIC - Abnormal; Notable for the  following components:   Hgb urine dipstick SMALL (*)    Leukocytes, UA TRACE (*)    Bacteria, UA RARE (*)    All other components within normal limits  CBG MONITORING, ED - Abnormal; Notable for the following components:   Glucose-Capillary 104 (*)    All other components within normal limits  URINE CULTURE  CULTURE, BLOOD (ROUTINE X 2)  CULTURE, BLOOD (ROUTINE X 2)  POTASSIUM  I-STAT CG4 LACTIC ACID, ED  I-STAT TROPONIN, ED    EKG EKG Interpretation  Date/Time:  Tuesday July 18 2018 09:44:57 EDT Ventricular Rate:  63  PR Interval:    QRS Duration: 88 QT Interval:  431 QTC Calculation: 442 R Axis:   2 Text Interpretation:  Sinus rhythm Low voltage, precordial leads Confirmed by Kristine Royal [phone]) on 07/18/2018 9:48:45 AM   Radiology Ct Head Wo Contrast  Result Date: 07/18/2018 CLINICAL DATA:  Altered level of consciousness, unexplained. Patient is now unresponsive. Recent UTI. EXAM: CT HEAD WITHOUT CONTRAST TECHNIQUE: Contiguous axial images were obtained from the base of the skull through the vertex without intravenous contrast. COMPARISON:  MRI brain 06/26/2018. CT head without contrast 06/26/2018. FINDINGS: Brain: Advanced atrophy and diffuse white matter changes are again seen. Remote lacunar infarcts of the basal ganglia are stable. No acute infarct, hemorrhage, or mass lesion is present. The ventricles are proportionate to the degree of atrophy. Brainstem and cerebellum within limits. Vascular: Atherosclerotic calcifications are present in the cavernous internal carotid arteries bilaterally and at the dural margin of both vertebral arteries. There is no hyperdense vessel. Skull: Calvarium is intact. Hyperostosis is noted. No focal lytic or blastic lesions are present. No significant extracranial soft tissue lesions are present. Sinuses/Orbits: The paranasal sinuses and mastoid air cells are clear. Bilateral lens replacements are present. Globes and orbits are otherwise  within limits. IMPRESSION: 1. No acute intracranial abnormality. 2. Stable advanced atrophy and diffuse white matter disease. This likely reflects the sequela of chronic microvascular ischemia. 3. Stable remote lacunar infarcts of the basal ganglia bilaterally. 4. Atherosclerosis. Electronically Signed   By: Marin Roberts M.D.   On: 07/18/2018 11:00   Dg Chest Portable 1 View  Result Date: 07/18/2018 CLINICAL DATA:  Altered mental status EXAM: PORTABLE CHEST 1 VIEW COMPARISON:  June 26, 2018 FINDINGS: There is no edema or consolidation. Heart is upper normal in size with pulmonary vascularity normal. There is aortic atherosclerosis. No evident adenopathy. No bone lesions. IMPRESSION: Aortic atherosclerosis. No edema or consolidation. Stable cardiac silhouette. Aortic Atherosclerosis (ICD10-I70.0). Electronically Signed   By: Bretta Bang III M.D.   On: 07/18/2018 10:43    Procedures Procedures (including critical care time)  Medications Ordered in ED Medications  sodium chloride 0.9 % bolus 500 mL (500 mLs Intravenous New Bag/Given 07/18/18 1151)  ceFAZolin (ANCEF) IVPB 1 g/50 mL premix (1 g Intravenous New Bag/Given 07/18/18 1153)     Initial Impression / Assessment and Plan / ED Course  I have reviewed the triage vital signs and the nursing notes.  Pertinent labs & imaging results that were available during my care of the patient were reviewed by me and considered in my medical decision making (see chart for details).  Clinical Course as of Jul 18 1256  Tue Jul 18, 2018  1115 Spoke with nurse at [facility] [place]. Patient was last seen normal at bedtime around 9 PM last night.  They went in to get her up for breakfast around 8:15 AM this morning.  Patient groaned and "did not want to get up."  Ms. Maisie Fus states this is not unusual for the patient.  They came back in after 10:00 and noted the patient was unresponsive with SPO2 at 70-80%. She was increasingly  returning to normal since her discharge from the hospital middle of August.  She is normally ambulatory with a walker and confused, but conversational.  No recent falls. She states patient was treated with a course of antibiotics for UTI and another antibiotic for leg wound infection at the beginning of August.   [SJ]  1130 Spoke with patient's daughter, Steward Drone, at the  bedside. She reaffirms patient is typically pleasantly confused, but conversational and ambulatory.   [SJ]  [id number] Spoke with Dr. Frances Furbish, IM resident. Agrees to evaluate the patient for admission.   [SJ]  1202 Suspect this may be due to hemolysis.  We will sample the potassium.  Potassium(!): 5.4 [SJ]  1215 Dr. Frances Furbish called back stating patient is actually a [facility] bounce-back and will need to be admitted through them.   [SJ]  1240 Spoke with Dr. Ophelia Charter, hospitalist. Agrees to admit the patient.    [SJ]  1250 RN informs me that bed request order had not been removed by IM team. Patient was transported to 6N.  I updated hospitalist on this matter, states she will take care of it.   [SJ]    Clinical Course User Index [SJ] Joy, Shawn C, PA-C    Patient presents with altered mental status with improvement since EMS contact.  Patient does have a wound on the left lower leg with appearance of surrounding cellulitis. No acute on normality on chest x-ray or head CT.  No fever, tachycardia, hypotension, leukocytosis, or lactic acidosis. Admitted for further management.  Repeat potassium pending at admission.  Findings and plan of care discussed with Kristine Royal, MD. Dr. Rodena Medin personally evaluated and examined this patient.  Vitals:   07/18/18 0953 07/18/18 1018 07/18/18 1030 07/18/18 1130  BP:   (!) 140/38 (!) 117/55  Pulse: 66  60 (!) 58  Resp: 16  18 16   Temp:  99.6 F (37.6 C)    TempSrc:  Rectal    SpO2: 97%  98% 100%  Weight:      Height:         Final Clinical Impressions(s) / ED Diagnoses    Final diagnoses:  Encephalopathy  Cellulitis of left lower extremity    ED Discharge Orders    None       Concepcion Living 07/18/18 1207    Anselm Pancoast, PA-C 07/18/18 1258    Wynetta Fines, MD 07/20/18 [phone]

## 2018-07-18 NOTE — Consult Note (Addendum)
Neurology Consultation  Reason for Consult: Altered mental status possible dementia Referring Physician: Ophelia Charter    History is obtained from: Chart  HPI: Judith Turner is a 84 y.o. female with history of COPD, diabetes, gout, hyperlipidemia, hypertension, renal disease, stroke.  Currently patient is extremely drowsy, difficult to keep from staying awake, states that she is at the [facility], having a difficult time getting her arouse even with sternal rub.  Patient has received no sedate of drugs.  She was recently seen back and August by neurology.  At that time she did have an EEG which did not show any epileptiform activity.  She was also confused in the setting of UTI.  Receiving antibiotics patient significantly improved however remains slightly confused about where she was.  But she was up eating breakfast without difficulty.  At that time was believed most likely to be metabolic encephalopathy in the setting of possible underlying neurocognitive decline.  Patient was admitted from home today for the exact same reason that she was admitted prior.  As noted in H&P the previous time there was question of seizure and started on Keppra 500 mg twice daily.  During hospitalization patient showed no side effects.  Patient had gradual progression although per family her cognitive delays were still impaired.  As of this Saturday she had a good day but this a.m. she went back to being unresponsive.  Per daughter however if anything disrupt her routine she gets very confused.  She did not recognize her facility upon return from the hospital.  CMP on arrival did show slightly elevated potassium at 5.4, BUN of 32 which is elevated from previous 14, creatinine of 1.72 which is elevated from previous 1.19.  Both urine culture and blood cultures are pending.  However she does not have a leukocytosis.   I called the daughter is very pleasant and seems to understand that her mother most likely has dementia and any  environmental changes can cause significant changes in confusion with her mother in addition that she is very fragile with any metabolic changes.  Apparently staff at the skilled nursing facility had called the daughter and recommend that she come here.  Patient has not driven in a long time, patient cannot cook for herself, patient cannot bathe herself, patient has a nurse that dresses her.  I explained to the daughter that although she is on Keppra by the time she had left the hospital she was having no side effects on Keppra thus I do not believe that the Keppra is causing the problem.  The daughter was very understanding.  Apparently the skilled nursing facility had a physician on staff but has not been on staff.  She believes that they are going to get a new physician on staff at the skilled nursing facility within the week.     ROS: Unable to obtain due to altered mental status.   Past Medical History:  Diagnosis Date  . COPD (chronic obstructive pulmonary disease) (HCC)   . Diabetes mellitus without complication (HCC)   . Gout   . Hyperlipidemia   . Hypertension   . Renal disorder    Stage 1 renal failure  . Stroke [facility])    TIA no residual  deficits    Family History  Problem Relation Age of Onset  . Hypertension Mother   . Hypertension Father      Social History:   reports that she has never smoked. She uses smokeless tobacco. She reports that she does not  drink alcohol or use drugs.  Medications  Current Facility-Administered Medications:  .  acetaminophen (TYLENOL) tablet 650 mg, 650 mg, Oral, Q6H PRN **OR** acetaminophen (TYLENOL) suppository 650 mg, 650 mg, Rectal, Q6H PRN, Jonah Blue, MD .  Melene Muller ON 07/19/2018] allopurinol (ZYLOPRIM) tablet 100 mg, 100 mg, Oral, Daily, Jonah Blue, MD .  Melene Muller ON 07/19/2018] aspirin tablet 325 mg, 325 mg, Oral, Daily, Jonah Blue, MD .  Melene Muller ON 07/19/2018] docusate sodium (COLACE) capsule 100 mg, 100 mg, Oral, BID, Jonah Blue, MD .  enoxaparin (LOVENOX) injection 30 mg, 30 mg, Subcutaneous, Q24H, Titus Mould, RPH .  HYDROcodone-acetaminophen (NORCO/VICODIN) 5-325 MG per tablet 1 tablet, 1 tablet, Oral, Q4H PRN, Jonah Blue, MD .  lactated ringers infusion, , Intravenous, Continuous, Jonah Blue, MD .  levETIRAcetam (KEPPRA) IVPB 500 mg/100 mL premix, 500 mg, Intravenous, Q12H, Jonah Blue, MD .  Melene Muller ON 07/19/2018] metoprolol tartrate (LOPRESSOR) tablet 25 mg, 25 mg, Oral, Daily, Jonah Blue, MD .  ondansetron [facility]) tablet 4 mg, 4 mg, Oral, Q6H PRN **OR** ondansetron (ZOFRAN) injection 4 mg, 4 mg, Intravenous, Q6H PRN, Jonah Blue, MD   Exam: Current vital signs: BP (!) 143/56 (BP Location: Right Arm)   Pulse (!) 57   Temp 98.1 F (36.7 C) (Axillary)   Resp 16   Ht 5\' 4"  (1.626 m)   Wt 69.7 kg   SpO2 96%   BMI 26.38 kg/m  Vital signs in last 24 hours: Temp:  [98.1 F (36.7 C)-99.6 F (37.6 C)] 98.1 F (36.7 C) (09/03 1338) Pulse Rate:  [57-66] 57 (09/03 1338) Resp:  [16-18] 16 (09/03 1338) BP: (117-145)/(38-59) 143/56 (09/03 1338) SpO2:  [89 %-100 %] 96 % (09/03 1338) Weight:  [69.7 kg-70 kg] 69.7 kg (09/03 1338)  GENERAL: Very lethargic HEENT: - Normocephalic  Ext: warm, well perfused, intact peripheral pulses,  NEURO:  Mental Status: Extremely lethargic, needs constant stimulation to keep her awake, keeps her eyes closed, when asked where she is she states that she is in [facility] however that took about 5 questions as she is very hypophonic.  She does not follow commands however winces and states ouch and withdraws from all noxious stimuli. Cranial Nerves: PERRL 2 mm/brisk.  Doll's eyes intact, could not get her to blink to threat , no facial asymmetry, facial sensation intact, hearing intact, Motor: Noxious stimuli she withdraws all 4 extremities Sensation- Intact to noxious stimuli     Labs I have reviewed labs in epic and the results  pertinent to this consultation are:   CBC    Component Value Date/Time   WBC 9.5 07/18/2018 1010   RBC 3.31 (L) 07/18/2018 1010   HGB 11.0 (L) 07/18/2018 1010   HCT 36.4 07/18/2018 1010   PLT 118 (L) 07/18/2018 1010   MCV 110.0 (H) 07/18/2018 1010   MCH 33.2 07/18/2018 1010   MCHC 30.2 07/18/2018 1010   RDW 14.4 07/18/2018 1010   LYMPHSABS 2.2 07/18/2018 1010   MONOABS 0.5 07/18/2018 1010   EOSABS 0.4 07/18/2018 1010   BASOSABS 0.0 07/18/2018 1010    CMP     Component Value Date/Time   NA 141 07/18/2018 1010   K 5.4 (H) 07/18/2018 1138   CL 105 07/18/2018 1010   CO2 26 07/18/2018 1010   GLUCOSE 107 (H) 07/18/2018 1010   BUN 32 (H) 07/18/2018 1010   CREATININE 1.72 (H) 07/18/2018 1010   CALCIUM 8.7 (L) 07/18/2018 1010   PROT 5.5 (L) 07/18/2018 1010  ALBUMIN 2.8 (L) 07/18/2018 1010   AST 13 (L) 07/18/2018 1010   ALT 9 07/18/2018 1010   ALKPHOS 60 07/18/2018 1010   BILITOT 0.6 07/18/2018 1010   GFRNONAA 25 (L) 07/18/2018 1010   GFRAA 29 (L) 07/18/2018 1010    Lipid Panel     Component Value Date/Time   CHOL 191 06/26/2018 0334   TRIG 219 (H) 06/26/2018 0334   HDL 41 06/26/2018 0334   CHOLHDL 4.7 06/26/2018 0334   VLDL 44 (H) 06/26/2018 0334   LDLCALC 106 (H) 06/26/2018 0334     Imaging I have reviewed the images obtained:  CT-scan of the brain--no intracranial abnormalities, stable advanced atrophy and diffuse white matter disease    Assessment: 84 year old female with very likely dementia based on her history of confusion with environmental changes who presents with lethargy/altered mental status in the setting of mild AKI.  It is possible that this represents dehydration, though with that change in her renal function it could also contribute to increase in blood levels of Keppra and some of her sedation may be due to this.  I would not, however, favor continuous medication prior to further evaluation with EEG.  Recommendations: - Routine EEG to again  rule out any nonclinical seizures - Continue to hydrate and adjust any metabolic issues that could cause encephalopathy -Ammonia, agree with urine cultures  Ritta Slot, MD [facility] [phone]  If 7pm- 7am, please page neurology on call as listed in AMION.

## 2018-07-18 NOTE — ED Notes (Signed)
Delmy Gaba ( daughter, [place]) [phone]

## 2018-07-18 NOTE — ED Notes (Signed)
Attempted to give report to floor nurse, nurse not available at this time , will call me back when she is ready for report

## 2018-07-18 NOTE — ED Notes (Signed)
Pt now more alert at this time

## 2018-07-19 DIAGNOSIS — N170 Acute kidney failure with tubular necrosis: Secondary | ICD-10-CM

## 2018-07-19 DIAGNOSIS — R400 Somnolence: Secondary | ICD-10-CM

## 2018-07-19 DIAGNOSIS — L03116 Cellulitis of left lower limb: Secondary | ICD-10-CM

## 2018-07-19 LAB — BLOOD CULTURE ID PANEL (REFLEXED)
Acinetobacter baumannii: NOT DETECTED
CANDIDA KRUSEI: NOT DETECTED
CANDIDA PARAPSILOSIS: NOT DETECTED
Candida albicans: NOT DETECTED
Candida glabrata: NOT DETECTED
Candida tropicalis: NOT DETECTED
ESCHERICHIA COLI: NOT DETECTED
Enterobacter cloacae complex: NOT DETECTED
Enterobacteriaceae species: NOT DETECTED
Enterococcus species: NOT DETECTED
Haemophilus influenzae: NOT DETECTED
KLEBSIELLA OXYTOCA: NOT DETECTED
Klebsiella pneumoniae: NOT DETECTED
Listeria monocytogenes: NOT DETECTED
Methicillin resistance: DETECTED — AB
Neisseria meningitidis: NOT DETECTED
PROTEUS SPECIES: NOT DETECTED
Pseudomonas aeruginosa: NOT DETECTED
SERRATIA MARCESCENS: NOT DETECTED
STAPHYLOCOCCUS AUREUS BCID: NOT DETECTED
STAPHYLOCOCCUS SPECIES: DETECTED — AB
Streptococcus agalactiae: NOT DETECTED
Streptococcus pneumoniae: NOT DETECTED
Streptococcus pyogenes: NOT DETECTED
Streptococcus species: NOT DETECTED

## 2018-07-19 LAB — BASIC METABOLIC PANEL
ANION GAP: 8 (ref 5–15)
BUN: 28 mg/dL — ABNORMAL HIGH (ref 8–23)
CALCIUM: 8.9 mg/dL (ref 8.9–10.3)
CO2: 25 mmol/L (ref 22–32)
Chloride: 106 mmol/L (ref 98–111)
Creatinine, Ser: 1.4 mg/dL — ABNORMAL HIGH (ref 0.44–1.00)
GFR calc Af Amer: 37 mL/min — ABNORMAL LOW (ref >60)
GFR calc non Af Amer: 32 mL/min — ABNORMAL LOW (ref >60)
Glucose, Bld: 98 mg/dL (ref 70–99)
POTASSIUM: 4.9 mmol/L (ref 3.5–5.1)
Sodium: 139 mmol/L (ref 135–145)

## 2018-07-19 LAB — CBC
HEMATOCRIT: 37 % (ref 36.0–46.0)
HEMOGLOBIN: 11.1 g/dL — AB (ref 12.0–15.0)
MCH: 33 pg (ref 26.0–34.0)
MCHC: 30 g/dL (ref 30.0–36.0)
MCV: 110.1 fL — ABNORMAL HIGH (ref 78.0–100.0)
Platelets: 86 10*3/uL — ABNORMAL LOW (ref 150–400)
RBC: 3.36 MIL/uL — ABNORMAL LOW (ref 3.87–5.11)
RDW: 14.3 % (ref 11.5–15.5)
WBC: 6.8 10*3/uL (ref 4.0–10.5)

## 2018-07-19 LAB — HEMOGLOBIN A1C
HEMOGLOBIN A1C: 5.6 % (ref 4.8–5.6)
MEAN PLASMA GLUCOSE: 114.02 mg/dL

## 2018-07-19 LAB — GLUCOSE, CAPILLARY
Glucose-Capillary: 99 mg/dL (ref 70–99)
Glucose-Capillary: 86 mg/dL (ref 70–99)
Glucose-Capillary: 100 mg/dL — ABNORMAL HIGH (ref 70–99)

## 2018-07-19 MED ORDER — METOPROLOL TARTRATE 5 MG/5ML IV SOLN
5.0000 mg | Freq: Once | INTRAVENOUS | Status: AC
  Administered 2018-07-19: 5 mg via INTRAVENOUS
  Filled 2018-07-19: qty 5

## 2018-07-19 MED ORDER — DEXTROSE-NACL 5-0.9 % IV SOLN
INTRAVENOUS | Status: DC
  Administered 2018-07-19 – 2018-07-22 (×6): via INTRAVENOUS

## 2018-07-19 MED ORDER — SODIUM CHLORIDE 0.9 % IV SOLN
1.0000 g | INTRAVENOUS | Status: DC
  Administered 2018-07-19 – 2018-07-20 (×2): 1 g via INTRAVENOUS
  Filled 2018-07-19: qty 1
  Filled 2018-07-19: qty 10

## 2018-07-19 MED ORDER — INSULIN ASPART 100 UNIT/ML ~~LOC~~ SOLN
0.0000 [IU] | Freq: Three times a day (TID) | SUBCUTANEOUS | Status: DC
  Administered 2018-07-20 (×2): 2 [IU] via SUBCUTANEOUS
  Administered 2018-07-21: 3 [IU] via SUBCUTANEOUS
  Administered 2018-07-21 – 2018-07-22 (×2): 2 [IU] via SUBCUTANEOUS

## 2018-07-19 MED ORDER — MUPIROCIN CALCIUM 2 % EX CREA
TOPICAL_CREAM | Freq: Every day | CUTANEOUS | Status: DC
  Administered 2018-07-19 – 2018-07-22 (×4): via TOPICAL
  Filled 2018-07-19: qty 15

## 2018-07-19 NOTE — Progress Notes (Signed)
Initial Nutrition Assessment  DOCUMENTATION CODES:   Not applicable  INTERVENTION:   -RD will follow for diet advancement and supplement diet as appropriate -Consider SLP assessment for swallow safety once pt is more alert  NUTRITION DIAGNOSIS:   Inadequate oral intake related to inability to eat as evidenced by NPO status.  GOAL:   Patient will meet greater than or equal to 90% of their needs  MONITOR:   Diet advancement, Labs, Weight trends, Skin, I & O's  REASON FOR ASSESSMENT:   Low Braden    ASSESSMENT:   Judith Turner is a 84 y.o. female with medical history significant of CVA; CKD; HTN; HLD; DM; and COPD presenting with AMS.   Pt admitted with AMS.   9/4- CWOCN evaluation  Pt did not arouse to voice or touch. No family at bedside to provide additional history.   Pt is NPO due to lethargy. Reviewed records from [facility]. Unsure of diet PTA and pt on no supplements PTA.   Reviewed wt hx; pt has experienced a 5.7% wt loss over the past 8 months, which is not significant for time frame.   Pt with increased nutritional needs related to wound healing and would likely benefit from supplements once diet is advanced.  Last Hgb A1c: 5.6 (07/19/18). PTA DM medications are 500 mg metformin daily.   Labs reviewed: CBGS: 99 (inpatient orders for glycemic control are 0-15 units insulin aspart TID with meals).   NUTRITION - FOCUSED PHYSICAL EXAM:    Most Recent Value  Orbital Region  No depletion  Upper Arm Region  Mild depletion  Thoracic and Lumbar Region  No depletion  Buccal Region  No depletion  Temple Region  No depletion  Clavicle Bone Region  No depletion  Clavicle and Acromion Bone Region  No depletion  Scapular Bone Region  No depletion  Dorsal Hand  No depletion  Patellar Region  No depletion  Anterior Thigh Region  No depletion  Posterior Calf Region  Mild depletion  Edema (RD Assessment)  Mild  Hair  Reviewed  Eyes  Reviewed  Mouth   Reviewed  Skin  Reviewed  Nails  Reviewed       Diet Order:   Diet Order            Diet NPO time specified  Diet effective now              EDUCATION NEEDS:   Not appropriate for education at this time  Skin:  Skin Assessment: Skin Integrity Issues: Skin Integrity Issues:: Other (Comment) Other: full thickness lt posterior leg  Last BM:  PTA  Height:   Ht Readings from Last 1 Encounters:  07/18/18 5\' 4"  (1.626 m)    Weight:   Wt Readings from Last 1 Encounters:  07/18/18 69.7 kg    Ideal Body Weight:  54.5 kg  BMI:  Body mass index is 26.38 kg/m.  Estimated Nutritional Needs:   Kcal:  1550-1750  Protein:  70-85 grams  Fluid:  1.5-1.7 L    Hadassah Rana A. Mayford Knife, RD, LDN, CDE Pager: [phone] After hours Pager: [phone]

## 2018-07-19 NOTE — Plan of Care (Signed)
  Problem: Pain Managment: Goal: General experience of comfort will improve Outcome: Progressing   Problem: Safety: Goal: Ability to remain free from injury will improve Outcome: Progressing   

## 2018-07-19 NOTE — Care Management Note (Addendum)
Case Management Note  Patient Details  Name: Judith Turner MRN: [id number] Date of Birth: 21-Jul-1923  Subjective/Objective:           AMS         Action/Plan:  Patient LTC resident at [facility]. Discussed w Ellyn Hack CSW for transition needs back to SNF.  Expected Discharge Date:                  Expected Discharge Plan:  Skilled Nursing Facility  In-House Referral:  Clinical Social Work  Discharge planning Services     Post Acute Care Choice:    Choice offered to:     DME Arranged:    DME Agency:     HH Arranged:    HH Agency:     Status of Service:  Completed, signed off  If discussed at [employer], dates discussed:    Additional Comments:  Lawerance Sabal, RN 07/19/2018, 11:38 AM

## 2018-07-19 NOTE — Consult Note (Addendum)
WOC Nurse wound consult note Reason for Consult: Consult requested for left leg.  Pt is familiar to [facility] team from a previous visit. Wound type: Left posterior leg with full thickness wound; 20% dark red-brown, 80% red, 9X8X.2cm Drainage (amount, consistency, odor) small amt pink drainage, no odor Periwound: intact skin surrounding Dressing procedure/placement/frequency: Bactroban to promote moist healing, foam dressing to protect from further injury.  No family members at the bedside to discuss plan of care.   Assessed buttocks and skin is intact without wounds or moisture associated skin breakdown. Purewick in place to attempt to contain urine. Please re-consult if further assistance is needed.  Thank-you,  Cammie Mcgee MSN, RN, CWOCN, [place], CNS [phone]

## 2018-07-19 NOTE — Progress Notes (Signed)
PHARMACY - PHYSICIAN COMMUNICATION CRITICAL VALUE ALERT - BLOOD CULTURE IDENTIFICATION (BCID)  Adrionna Fetsch is an 84 y.o. female who presented to [facility] on 07/18/2018 with a chief complaint of AMS  Assessment:  1/2 blood cultures growing MR-CoNS.  Likely contaminant as patient has been afebrile without leukocytosis.  Name of physician (or Provider) Contacted:   Left sticky note for rounding MD  Current antibiotics: None  Changes to prescribed antibiotics recommended:   None.  Likely contaminant  Results for orders placed or performed during the hospital encounter of 07/18/18  Blood Culture ID Panel (Reflexed) (Collected: 07/18/2018 10:10 AM)  Result Value Ref Range   Enterococcus species NOT DETECTED NOT DETECTED   Listeria monocytogenes NOT DETECTED NOT DETECTED   Staphylococcus species DETECTED (A) NOT DETECTED   Staphylococcus aureus NOT DETECTED NOT DETECTED   Methicillin resistance DETECTED (A) NOT DETECTED   Streptococcus species NOT DETECTED NOT DETECTED   Streptococcus agalactiae NOT DETECTED NOT DETECTED   Streptococcus pneumoniae NOT DETECTED NOT DETECTED   Streptococcus pyogenes NOT DETECTED NOT DETECTED   Acinetobacter baumannii NOT DETECTED NOT DETECTED   Enterobacteriaceae species NOT DETECTED NOT DETECTED   Enterobacter cloacae complex NOT DETECTED NOT DETECTED   Escherichia coli NOT DETECTED NOT DETECTED   Klebsiella oxytoca NOT DETECTED NOT DETECTED   Klebsiella pneumoniae NOT DETECTED NOT DETECTED   Proteus species NOT DETECTED NOT DETECTED   Serratia marcescens NOT DETECTED NOT DETECTED   Haemophilus influenzae NOT DETECTED NOT DETECTED   Neisseria meningitidis NOT DETECTED NOT DETECTED   Pseudomonas aeruginosa NOT DETECTED NOT DETECTED   Candida albicans NOT DETECTED NOT DETECTED   Candida glabrata NOT DETECTED NOT DETECTED   Candida krusei NOT DETECTED NOT DETECTED   Candida parapsilosis NOT DETECTED NOT DETECTED   Candida tropicalis NOT DETECTED NOT  DETECTED    Eddie Candle 07/19/2018  5:39 AM

## 2018-07-19 NOTE — Progress Notes (Addendum)
Subjective: Patient at this point was asleep upon entering the room but easily wakes up with vocal stimulation.  She is very confused does not know where she is but she does follow commands.  She is more alert than yesterday.  She is complaining of some lower abdominal pain  Exam: Vitals:   07/18/18 2147 07/19/18 0449  BP: (!) 162/69 (!) 157/94  Pulse: 64 73  Resp: 17 17  Temp: 97.9 F (36.6 C) (!) 97.5 F (36.4 C)  SpO2: 96% 100%    Physical Exam   HEENT-  Normocephalic, no lesions, without obvious abnormality.  Normal external eye and conjunctiva.   Extremities- Warm, dry and intact--with lower extremity edema Musculoskeletal-no joint tenderness, deformity or swelling Skin-warm and dry, no hyperpigmentation, vitiligo, or suspicious lesions    Neuro:  Mental Status: Initially patient was resting comfortably, awoke very easily with verbal stimuli, she does not know where she is however she does not significantly attempt to know where she is at this point in time.  She is following simple commands such as raising her arms, opening her eyes, although I asked her to smile she refused to smile. Cranial Nerves: II:  Visual fields grossly normal,  III,IV, VI: Positive ptosis of the right eye however I do see a scar on the lateral aspect of her canthus and I question if she has had surgery., extra-ocular motions intact bilaterally pupils equal, round, reactive to light and accommodation V,VII: smile symmetric, facial light touch sensation normal bilaterally VIII: hearing normal bilaterally  Motor: Able to move upper extremities antigravity with mild clonus noted attempted to lift bilateral lower legs however cannot raise above bed but does have 2/5 strength. Sensory: Pinprick and light touch intact throughout, bilaterally Deep Tendon Reflexes: 1+ symmetrical in the upper extremities I could not get ankle jerk or knee jerk.  It should be known the patient was apprehensive and felt pain  every time I attempted thus was not relaxed Plantars: Right: Mute   left: downgoing Cerebellar: normal finger-to-nose and asterixis also noted Gait: Not tested    Medications:  Scheduled: . allopurinol  100 mg Oral Daily  . aspirin  325 mg Oral Daily  . docusate sodium  100 mg Oral BID  . enoxaparin (LOVENOX) injection  30 mg Subcutaneous Q24H  . metoprolol tartrate  25 mg Oral Daily  . mupirocin cream   Topical Daily   Continuous: . lactated ringers 75 mL/hr at 07/19/18 0332  . levETIRAcetam 500 mg (07/19/18 0334)    Pertinent Labs/Diagnostics: -BUN 28 -Creatinine 1.4 -Urine culture did show 50,000 colonies of gram-negative rods. -EEG Abnormalities: 1) generalized irregular slow activity 2) slow posterior dominant rhythm  Clinical Interpretation: This normal EEG is recorded in the waking and drowsy state. There was no seizure or seizure predisposition recorded on this study - Ammonia within normal limits      Assessment: Given the EEG is negative and urine culture shows 50,000 colonies of gram-negative rods I believe that her confusion is secondary to toxic/metabolic encephalopathy.    Recommendations: - Treat UTI -Continue to treat underlying dehydration and renal function. Neuro S/O  Tomma Spiva PA-C [facility] [phone]   07/19/2018, 8:42 AM

## 2018-07-19 NOTE — Progress Notes (Addendum)
[facility] PROGRESS NOTE  Donise Woodle ZOX:[id number] DOB: 01-26-1923 DOA: 07/18/2018   PCP: System, Pcp Not In     Assessment/Plan: Principal Problem:   Altered mental status Active Problems:   Diabetes mellitus without complication (HCC)   Skin ulcer-left leg   Essential hypertension   Acute renal failure (ARF) (HCC)   84 y.o. female with history of COPD, diabetes, gout, hyperlipidemia, hypertension, renal disease, stroke.   patient admitted for worsening dementia, UTI  Assessment and plan Worsening dementia Presents with confusion with environmental changes, lethargy, altered mental status in the setting of acute kidney injury and UTI Patient arousable to voice stimulation, confused, Ammonia within normal limits Treat underlying causes  UTI with a positive urine culture Started on Rocephin to see his mental status improves Awaiting urine culture sensitivity  Hypertension continue metoprolol,   gout continue allopurinol,   history of seizure disorder continue Keppra  Skin ulcer Left posterior leg with full thickness wound; 20% dark red-brown, 80% red, 9X8X.2cm -The ulceration on the back of her leg is likely exacerbated by immobility - appreciate wound care consult and recommendations    HTN -Continue Lopressor  DM -hold Glucophage -Will   cover with SSI     DVT prophylaxsis Lovenox  Code Status:  Full code       Family Communication: Discussed in detail with the patient, all imaging results, lab results explained to the patient   Disposition Plan:  Continue to treat UTI, sensitivity pending, back to SNF when mental status improves     Consultants:  neurology  Procedures:  none  Antibiotics: Anti-infectives (From admission, onward)   Start     Dose/Rate Route Frequency Ordered Stop   07/19/18 1300  cefTRIAXone (ROCEPHIN) 1 g in sodium chloride 0.9 % 100 mL IVPB     1 g 200 mL/hr over 30 Minutes Intravenous Every 24 hours  07/19/18 1228     07/18/18 1145  ceFAZolin (ANCEF) IVPB 1 g/50 mL premix     1 g 100 mL/hr over 30 Minutes Intravenous  Once 07/18/18 1144 07/18/18 1235         HPI/Subjective:  remains confused but arousable  Objective: Vitals:   07/18/18 1230 07/18/18 1338 07/18/18 2147 07/19/18 0449  BP: (!) 145/59 (!) 143/56 (!) 162/69 (!) 157/94  Pulse: (!) 57 (!) 57 64 73  Resp: 16 16 17 17   Temp:  98.1 F (36.7 C) 97.9 F (36.6 C) (!) 97.5 F (36.4 C)  TempSrc:  Axillary Oral Oral  SpO2: 100% 96% 96% 100%  Weight:  69.7 kg    Height:  5\' 4"  (1.626 m)      Intake/Output Summary (Last 24 hours) at 07/19/2018 1258 Last data filed at 07/19/2018 1100 Gross per 24 hour  Intake 1146.15 ml  Output 750 ml  Net 396.15 ml    Exam:  Examination:  General exam: confused Respiratory system: Clear to auscultation. Respiratory effort normal. Cardiovascular system: S1 & S2 heard, RRR. No JVD, murmurs, rubs, gallops or clicks. No pedal edema. Gastrointestinal system: Abdomen is nondistended, soft and nontender. No organomegaly or masses felt. Normal bowel sounds heard. Central nervous system:  Remains confused but arousable Extremities: Symmetric 5 x 5 power. Skin: No rashes, lesions or ulcers Psychiatry: confused but arousable     Data Reviewed: I have personally reviewed following labs and imaging studies  Micro Results Recent Results (from the past 240 hour(s))  Culture, blood (routine x 2)  Status: None (Preliminary result)   Collection Time: 07/18/18 10:10 AM  Result Value Ref Range Status   Specimen Description BLOOD RIGHT ANTECUBITAL  Final   Special Requests   Final    BOTTLES DRAWN AEROBIC AND ANAEROBIC Blood Culture adequate volume   Culture  Setup Time   Final    GRAM POSITIVE COCCI AEROBIC BOTTLE ONLY Organism ID to follow CRITICAL RESULT CALLED TO, READ BACK BY AND VERIFIED WITH: G ABBOTT PHARMD 0518 07/19/18 A BROWNING    Culture   Final    NO GROWTH < 24  HOURS Performed at [facility], [address] [place], [place] [address]    Report Status PENDING  Incomplete  Blood Culture ID Panel (Reflexed)     Status: Abnormal   Collection Time: 07/18/18 10:10 AM  Result Value Ref Range Status   Enterococcus species NOT DETECTED NOT DETECTED Final   Listeria monocytogenes NOT DETECTED NOT DETECTED Final   Staphylococcus species DETECTED (A) NOT DETECTED Final    Comment: Methicillin (oxacillin) resistant coagulase negative staphylococcus. Possible blood culture contaminant (unless isolated from more than one blood culture draw or clinical case suggests pathogenicity). No antibiotic treatment is indicated for blood  culture contaminants. CRITICAL RESULT CALLED TO, READ BACK BY AND VERIFIED WITH: G ABBOTT PHARMD 0518 07/19/18 A BROWNING    Staphylococcus aureus NOT DETECTED NOT DETECTED Final   Methicillin resistance DETECTED (A) NOT DETECTED Final    Comment: CRITICAL RESULT CALLED TO, READ BACK BY AND VERIFIED WITH: G ABBOTT PHARMD 0518 07/19/18 A BROWNING    Streptococcus species NOT DETECTED NOT DETECTED Final   Streptococcus agalactiae NOT DETECTED NOT DETECTED Final   Streptococcus pneumoniae NOT DETECTED NOT DETECTED Final   Streptococcus pyogenes NOT DETECTED NOT DETECTED Final   Acinetobacter baumannii NOT DETECTED NOT DETECTED Final   Enterobacteriaceae species NOT DETECTED NOT DETECTED Final   Enterobacter cloacae complex NOT DETECTED NOT DETECTED Final   Escherichia coli NOT DETECTED NOT DETECTED Final   Klebsiella oxytoca NOT DETECTED NOT DETECTED Final   Klebsiella pneumoniae NOT DETECTED NOT DETECTED Final   Proteus species NOT DETECTED NOT DETECTED Final   Serratia marcescens NOT DETECTED NOT DETECTED Final   Haemophilus influenzae NOT DETECTED NOT DETECTED Final   Neisseria meningitidis NOT DETECTED NOT DETECTED Final   Pseudomonas aeruginosa NOT DETECTED NOT DETECTED Final   Candida albicans NOT DETECTED NOT DETECTED  Final   Candida glabrata NOT DETECTED NOT DETECTED Final   Candida krusei NOT DETECTED NOT DETECTED Final   Candida parapsilosis NOT DETECTED NOT DETECTED Final   Candida tropicalis NOT DETECTED NOT DETECTED Final    Comment: Performed at [facility], [address] [place], [place] [address]  Urine culture     Status: Abnormal (Preliminary result)   Collection Time: 07/18/18 10:21 AM  Result Value Ref Range Status   Specimen Description URINE, RANDOM  Final   Special Requests   Final    NONE Performed at [facility], [address], [place], [place] [address]    Culture 50,000 COLONIES/mL ESCHERICHIA COLI (A)  Final   Report Status PENDING  Incomplete  Culture, blood (routine x 2)     Status: None (Preliminary result)   Collection Time: 07/18/18 10:50 AM  Result Value Ref Range Status   Specimen Description BLOOD BLOOD RIGHT ARM  Final   Special Requests   Final    BOTTLES DRAWN AEROBIC AND ANAEROBIC Blood Culture results may not be  optimal due to an excessive volume of blood received in culture bottles   Culture   Final    NO GROWTH < 24 HOURS Performed at [facility], [address] [place], [place] [address]    Report Status PENDING  Incomplete    Radiology Reports Ct Head Wo Contrast  Result Date: 07/18/2018 CLINICAL DATA:  Altered level of consciousness, unexplained. Patient is now unresponsive. Recent UTI. EXAM: CT HEAD WITHOUT CONTRAST TECHNIQUE: Contiguous axial images were obtained from the base of the skull through the vertex without intravenous contrast. COMPARISON:  MRI brain 06/26/2018. CT head without contrast 06/26/2018. FINDINGS: Brain: Advanced atrophy and diffuse white matter changes are again seen. Remote lacunar infarcts of the basal ganglia are stable. No acute infarct, hemorrhage, or mass lesion is present. The ventricles are proportionate to the degree of atrophy. Brainstem and cerebellum within limits. Vascular: Atherosclerotic  calcifications are present in the cavernous internal carotid arteries bilaterally and at the dural margin of both vertebral arteries. There is no hyperdense vessel. Skull: Calvarium is intact. Hyperostosis is noted. No focal lytic or blastic lesions are present. No significant extracranial soft tissue lesions are present. Sinuses/Orbits: The paranasal sinuses and mastoid air cells are clear. Bilateral lens replacements are present. Globes and orbits are otherwise within limits. IMPRESSION: 1. No acute intracranial abnormality. 2. Stable advanced atrophy and diffuse white matter disease. This likely reflects the sequela of chronic microvascular ischemia. 3. Stable remote lacunar infarcts of the basal ganglia bilaterally. 4. Atherosclerosis. Electronically Signed   By: Marin Roberts M.D.   On: 07/18/2018 11:00   Mr Brain Wo Contrast  Result Date: 06/26/2018 CLINICAL DATA:  84 year old female code stroke, altered mental status, slurred speech, left facial droop. EXAM: MRI HEAD WITHOUT CONTRAST TECHNIQUE: Multiplanar, multiecho pulse sequences of the brain and surrounding structures were obtained without intravenous contrast. COMPARISON:  Head CT without contrast 0131 hours today. Head face and cervical spine CT 12/01/2017. FINDINGS: Brain: No restricted diffusion or evidence of acute infarction. Extensive T2 heterogeneity in the bilateral deep gray matter nuclei appears to reflect a combination of dilated perivascular spaces and chronic lacunes (right caudate and are thalamus). There is a small chronic lacune in the left paracentral pons. There is a tiny chronic infarct in the right cerebellum (series 7, image 9). Patchy and confluent bilateral cerebral white matter T2 and FLAIR hyperintensity. Occasional chronic micro hemorrhages in the brain, including the left cerebellum on series 9, image 19. No midline shift, mass effect, evidence of mass lesion, ventriculomegaly, extra-axial collection or acute  intracranial hemorrhage. Cervicomedullary junction and pituitary are within normal limits. Vascular: Major intracranial vascular flow voids are preserved. Skull and upper cervical spine: Widespread cervical spine degeneration with mild reversal of lordosis. Hyperostosis of the calvarium. Visible bone marrow signal remains within normal limits. Sinuses/Orbits: Postoperative changes to both globes. Otherwise normal orbits soft tissues. Paranasal sinuses and mastoids are stable and well pneumatized. Other: Grossly normal visible internal auditory structures. Scalp and face soft tissues appear negative. IMPRESSION: 1.  No acute intracranial abnormality. 2. Moderate to advanced signal changes in the brain compatible with chronic small vessel disease. Electronically Signed   By: Odessa Fleming M.D.   On: 06/26/2018 08:42   Dg Chest Portable 1 View  Result Date: 07/18/2018 CLINICAL DATA:  Altered mental status EXAM: PORTABLE CHEST 1 VIEW COMPARISON:  June 26, 2018 FINDINGS: There is no edema or consolidation. Heart is upper normal in size with pulmonary vascularity normal. There is aortic  atherosclerosis. No evident adenopathy. No bone lesions. IMPRESSION: Aortic atherosclerosis. No edema or consolidation. Stable cardiac silhouette. Aortic Atherosclerosis (ICD10-I70.0). Electronically Signed   By: Bretta Bang III M.D.   On: 07/18/2018 10:43   Dg Chest Portable 1 View  Result Date: 06/26/2018 CLINICAL DATA:  Altered mental status EXAM: PORTABLE CHEST 1 VIEW COMPARISON:  None. FINDINGS: Cardiac shadows within normal limits. Aortic calcifications are seen. The lungs are well aerated bilaterally. No focal infiltrate or sizable effusion is noted. No bony abnormality is seen. IMPRESSION: No active disease. Electronically Signed   By: Alcide Clever M.D.   On: 06/26/2018 01:53   Ct Head Code Stroke Wo Contrast  Result Date: 06/26/2018 CLINICAL DATA:  Code stroke. Left-sided facial droop, right-sided gaze, right dilated  pupil. EXAM: CT HEAD WITHOUT CONTRAST TECHNIQUE: Contiguous axial images were obtained from the base of the skull through the vertex without intravenous contrast. COMPARISON:  12/01/2017 CT head FINDINGS: Brain: No evidence of acute infarction, hemorrhage, hydrocephalus, extra-axial collection or mass lesion/mass effect. Advanced chronic microvascular ischemic changes, volume loss of the brain, small chronic infarct in left hemi pons, and multiple small chronic infarcts in the bilateral basal ganglia are stable in comparison with prior CT of the head. Vascular: Calcific atherosclerosis of the carotid siphons and vertebral arteries. No hyperdense vessel identified. Skull: Normal. Negative for fracture or focal lesion. Sinuses/Orbits: No acute finding. Other: None. ASPECTS [facility] Stroke Program Early CT Score) - Ganglionic level infarction (caudate, lentiform nuclei, internal capsule, insula, M1-M3 cortex): 7 - Supraganglionic infarction (M4-M6 cortex): 3 Total score (0-10 with 10 being normal): 10 IMPRESSION: 1. No acute intracranial abnormality identified. 2. ASPECTS is 10 3. Stable advanced chronic microvascular ischemic changes, volume loss of the brain, and multiple small chronic infarcts in the basal ganglia as well as pons. These results were communicated to Dr. Laurence Slate at 1:40 amon 8/12/2019by text page via the [facility] messaging system. Electronically Signed   By: Mitzi Hansen M.D.   On: 06/26/2018 01:41     CBC Recent Labs  Lab 07/18/18 1010 07/19/18 0542  WBC 9.5 6.8  HGB 11.0* 11.1*  HCT 36.4 37.0  PLT 118* 86*  MCV 110.0* 110.1*  MCH 33.2 33.0  MCHC 30.2 30.0  RDW 14.4 14.3  LYMPHSABS 2.2  --   MONOABS 0.5  --   EOSABS 0.4  --   BASOSABS 0.0  --     Chemistries  Recent Labs  Lab 07/18/18 1010 07/18/18 1138 07/19/18 0542  NA 141  --  139  K 5.4* 5.4* 4.9  CL 105  --  106  CO2 26  --  25  GLUCOSE 107*  --  98  BUN 32*  --  28*  CREATININE 1.72*  --  1.40*  CALCIUM  8.7*  --  8.9  AST 13*  --   --   ALT 9  --   --   ALKPHOS 60  --   --   BILITOT 0.6  --   --    ------------------------------------------------------------------------------------------------------------------ estimated creatinine clearance is 24.6 mL/min (A) (by C-G formula based on SCr of 1.4 mg/dL (H)). ------------------------------------------------------------------------------------------------------------------ No results for input(s): HGBA1C in the last 72 hours. ------------------------------------------------------------------------------------------------------------------ No results for input(s): CHOL, HDL, LDLCALC, TRIG, CHOLHDL, LDLDIRECT in the last 72 hours. ------------------------------------------------------------------------------------------------------------------ No results for input(s): TSH, T4TOTAL, T3FREE, THYROIDAB in the last 72 hours.  Invalid input(s): FREET3 ------------------------------------------------------------------------------------------------------------------ No results for input(s): VITAMINB12, FOLATE, FERRITIN, TIBC, IRON, RETICCTPCT in the last 72 hours.  Coagulation profile No results for input(s): INR, PROTIME in the last 168 hours.  No results for input(s): DDIMER in the last 72 hours.  Cardiac Enzymes No results for input(s): CKMB, TROPONINI, MYOGLOBIN in the last 168 hours.  Invalid input(s): CK ------------------------------------------------------------------------------------------------------------------ Invalid input(s): POCBNP   CBG: Recent Labs  Lab 07/18/18 1024 07/19/18 1137  GLUCAP 104* 99       Studies: Ct Head Wo Contrast  Result Date: 07/18/2018 CLINICAL DATA:  Altered level of consciousness, unexplained. Patient is now unresponsive. Recent UTI. EXAM: CT HEAD WITHOUT CONTRAST TECHNIQUE: Contiguous axial images were obtained from the base of the skull through the vertex without intravenous contrast.  COMPARISON:  MRI brain 06/26/2018. CT head without contrast 06/26/2018. FINDINGS: Brain: Advanced atrophy and diffuse white matter changes are again seen. Remote lacunar infarcts of the basal ganglia are stable. No acute infarct, hemorrhage, or mass lesion is present. The ventricles are proportionate to the degree of atrophy. Brainstem and cerebellum within limits. Vascular: Atherosclerotic calcifications are present in the cavernous internal carotid arteries bilaterally and at the dural margin of both vertebral arteries. There is no hyperdense vessel. Skull: Calvarium is intact. Hyperostosis is noted. No focal lytic or blastic lesions are present. No significant extracranial soft tissue lesions are present. Sinuses/Orbits: The paranasal sinuses and mastoid air cells are clear. Bilateral lens replacements are present. Globes and orbits are otherwise within limits. IMPRESSION: 1. No acute intracranial abnormality. 2. Stable advanced atrophy and diffuse white matter disease. This likely reflects the sequela of chronic microvascular ischemia. 3. Stable remote lacunar infarcts of the basal ganglia bilaterally. 4. Atherosclerosis. Electronically Signed   By: Marin Roberts M.D.   On: 07/18/2018 11:00   Dg Chest Portable 1 View  Result Date: 07/18/2018 CLINICAL DATA:  Altered mental status EXAM: PORTABLE CHEST 1 VIEW COMPARISON:  June 26, 2018 FINDINGS: There is no edema or consolidation. Heart is upper normal in size with pulmonary vascularity normal. There is aortic atherosclerosis. No evident adenopathy. No bone lesions. IMPRESSION: Aortic atherosclerosis. No edema or consolidation. Stable cardiac silhouette. Aortic Atherosclerosis (ICD10-I70.0). Electronically Signed   By: Bretta Bang III M.D.   On: 07/18/2018 10:43      Lab Results  Component Value Date   HGBA1C 5.5 06/26/2018   Lab Results  Component Value Date   LDLCALC 106 (H) 06/26/2018   CREATININE 1.40 (H) 07/19/2018        Scheduled Meds: . allopurinol  100 mg Oral Daily  . aspirin  325 mg Oral Daily  . docusate sodium  100 mg Oral BID  . enoxaparin (LOVENOX) injection  30 mg Subcutaneous Q24H  . metoprolol tartrate  25 mg Oral Daily  . mupirocin cream   Topical Daily   Continuous Infusions: . cefTRIAXone (ROCEPHIN)  IV    . lactated ringers 75 mL/hr at 07/19/18 0332  . levETIRAcetam 500 mg (07/19/18 0334)     LOS: 1 day    Time spent: >30 MINS    Richarda Overlie  [facility] Pager [phone]. If 7PM-7AM, please contact night-coverage at [email or web address], password [email or web address] 07/19/2018, 12:58 PM  LOS: 1 day

## 2018-07-20 LAB — URINE CULTURE: Culture: 50000 — AB

## 2018-07-20 LAB — GLUCOSE, CAPILLARY
GLUCOSE-CAPILLARY: 119 mg/dL — AB (ref 70–99)
Glucose-Capillary: 126 mg/dL — ABNORMAL HIGH (ref 70–99)
Glucose-Capillary: 148 mg/dL — ABNORMAL HIGH (ref 70–99)
Glucose-Capillary: 101 mg/dL — ABNORMAL HIGH (ref 70–99)

## 2018-07-20 MED ORDER — CEPHALEXIN 250 MG PO CAPS
250.0000 mg | ORAL_CAPSULE | Freq: Two times a day (BID) | ORAL | Status: DC
  Administered 2018-07-21 – 2018-07-22 (×3): 250 mg via ORAL
  Filled 2018-07-20 (×3): qty 1

## 2018-07-20 MED ORDER — LABETALOL HCL 5 MG/ML IV SOLN
10.0000 mg | Freq: Once | INTRAVENOUS | Status: AC
  Administered 2018-07-21: 10 mg via INTRAVENOUS
  Filled 2018-07-20: qty 4

## 2018-07-20 MED ORDER — HYDRALAZINE HCL 20 MG/ML IJ SOLN: 10.0000 mg | Freq: Four times a day (QID) | INTRAMUSCULAR | Status: DC | PRN

## 2018-07-20 NOTE — Evaluation (Signed)
Clinical/Bedside Swallow Evaluation Patient Details  Name: Judith Turner MRN: [id number] Date of Birth: 15-Aug-1923  Today's Date: 07/20/2018 Time: SLP Start Time (ACUTE ONLY): 0854 SLP Stop Time (ACUTE ONLY): 0905 SLP Time Calculation (min) (ACUTE ONLY): 11 min  Past Medical History:  Past Medical History:  Diagnosis Date  . COPD (chronic obstructive pulmonary disease) (HCC)   . Diabetes mellitus without complication (HCC)   . Gout   . Hyperlipidemia   . Hypertension   . Renal disorder    Stage 1 renal failure  . Stroke [facility])    TIA no residual  deficits   Past Surgical History: History reviewed. No pertinent surgical history. HPI:  Pt is a 84 y.o. female presenting with AMS from worsening dementia and UTI. CT negative for acute changes; CXR negative for consolidation. BSE completed during recent admission in August 2019 recommended NPO due to mentation, but she was advanced the next day to Dys 3 diet and thin liquids as her mentation improved. PMH: CVA, CKD, HTN, HLD, DM, and COPD    Assessment / Plan / Recommendation Clinical Impression  Pt is confused and resistent to evaluation, but was agreeable to consuming water and graham crackers. No overt signs of aspiration were noted, although mastication is moderately prolonged given small bites, and oral residue remains. SLP provided Mod cues and liquid washes for clearance. Recommend starting Dys 2 diet and thin liquids with full supervision during meals given mentation. SLP to f/u for tolerance and potential to advance. SLP Visit Diagnosis: Dysphagia, oral phase (R13.11)    Aspiration Risk  Mild aspiration risk    Diet Recommendation Dysphagia 2 (Fine chop);Thin liquid   Liquid Administration via: Cup;Straw Medication Administration: Crushed with puree Supervision: Full supervision/cueing for compensatory strategies;Staff to assist with self feeding Compensations: Slow rate;Small sips/bites;Follow solids with liquid Postural  Changes: Seated upright at 90 degrees    Other  Recommendations Oral Care Recommendations: Oral care BID   Follow up Recommendations Skilled Nursing facility      Frequency and Duration min 2x/week  2 weeks       Prognosis Prognosis for Safe Diet Advancement: Fair Barriers to Reach Goals: Cognitive deficits      Swallow Study   General HPI: Pt is a 84 y.o. female presenting with AMS from worsening dementia and UTI. CT negative for acute changes; CXR negative for consolidation. BSE completed during recent admission in August 2019 recommended NPO due to mentation, but she was advanced the next day to Dys 3 diet and thin liquids as her mentation improved. PMH: CVA, CKD, HTN, HLD, DM, and COPD  Type of Study: Bedside Swallow Evaluation Previous Swallow Assessment: see HPI Diet Prior to this Study: NPO Temperature Spikes Noted: No Respiratory Status: Nasal cannula History of Recent Intubation: No Behavior/Cognition: Alert;Confused;Requires cueing Oral Care Completed by SLP: No Self-Feeding Abilities: Needs assist Patient Positioning: Upright in bed Baseline Vocal Quality: Normal    Oral/Motor/Sensory Function Overall Oral Motor/Sensory Function: (appears functional during observation)   Ice Chips Ice chips: Not tested   Thin Liquid Thin Liquid: Within functional limits Presentation: Straw    Nectar Thick Nectar Thick Liquid: Not tested   Honey Thick Honey Thick Liquid: Not tested   Puree Puree: Not tested   Solid     Solid: Impaired Oral Phase Functional Implications: Oral residue;Impaired mastication      Maxcine Ham 07/20/2018,9:14 AM  Maxcine Ham, M.A. CCC-SLP Acute Herbalist [phone] Office [phone]

## 2018-07-20 NOTE — Clinical Social Work Note (Signed)
Clinical Social Work Assessment  Patient Details  Name: Judith Turner MRN: [id number] Date of Birth: [birth year]  Date of referral:  07/20/18               Reason for consult:  Facility Placement, Discharge Planning                Permission sought to share information with:  Facility Medical sales representative, Family Supports Permission granted to share information::  No  Name::     Judith Turner  Agency::  [employer]  Relationship::  daughter  Contact Information:  [phone]  Housing/Transportation Living arrangements for the past 2 months:  Skilled Nursing Facility Source of Information:  Adult Children Patient Interpreter Needed:  None Criminal Activity/Legal Involvement Pertinent to Current Situation/Hospitalization:  No - Comment as needed Significant Relationships:  Adult Children Lives with:  Facility Resident Do you feel safe going back to the place where you live?  Yes Need for family participation in patient care:  Yes (Comment)  Care giving concerns:  Pt is LTC at SNF, has had altered mental status more than baseline in the past month and has required mores assistance than baseline.    Social Worker assessment / plan:  CSW spoke with pt daughter via phone, pt has lived at [facility] for around 2 years this November. Pt daughter is pleased with care there and desires for pt to return to SNF at discharge. Pt daughter interested in therapies when returning to SNF as pt has had decline in functionality over the past few months.   CSW explained that we would sent clinicals in for authorization for therapies but if pt medically ready to return would discharge pt under Medicaid benefit. Pt daughter states hope but understanding.   Employment status:  Retired Database administrator, Medicaid In [place] PT Recommendations:  Skilled Nursing Facility, 24 Hour Supervision Information / Referral to community resources:  Skilled Nursing  Facility  Patient/Family's Response to care:  Pt daughter states understanding of recommendations, CSW role and discharge planning.   Patient/Family's Understanding of and Emotional Response to Diagnosis, Current Treatment, and Prognosis:  Pt daughter knowledgeable for pt's diagnosis, current treatment and prognosis. Pt daughter states pleasure with pt placement at [facility] and is hopeful for therapies to be authorized for pt to received continued PT/OT at [facility]. Pt daughter knowledgeable about pt and expresses emotionally appropriate feelings and reasonable expectations for pt progress.   Emotional Assessment Appearance:  Appears stated age Attitude/Demeanor/Rapport:  Unable to Assess, Lethargic Affect (typically observed):  Unable to Assess Orientation:  Oriented to Self Alcohol / Substance use:  Not Applicable Psych involvement (Current and /or in the community):  No (Comment)  Discharge Needs  Concerns to be addressed:  Care Coordination Readmission within the last 30 days:  Yes Current discharge risk:  Cognitively Impaired, Dependent with Mobility, Physical Impairment Barriers to Discharge:  English as a second language teacher, Continued Medical Work up   [employer], LCSWA 07/20/2018, 6:51 PM
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
## 2018-07-20 NOTE — Progress Notes (Signed)
Patient currently on 3rd day of abx therapy being treated for UTI with rocephin. She is currently tolerating oral medications. Okay to change rocephin to Keflex 250mg  BID x2days per Dr. Susie Cassette.   Gwynneth Albright, Ilda Basset D PGY1 Pharmacy Resident  Phone [phone] 07/20/2018   2:34 PM

## 2018-07-20 NOTE — Progress Notes (Signed)
[facility] PROGRESS NOTE  Merryl Buckels ZOX:[id number] DOB: 07-30-1923 DOA: 07/18/2018   PCP: System, Pcp Not In     Assessment/Plan: Principal Problem:   Altered mental status Active Problems:   Diabetes mellitus without complication (HCC)   Skin ulcer-left leg   Essential hypertension   Acute renal failure (ARF) (HCC)   84 y.o. female with history of COPD, diabetes, gout, hyperlipidemia, hypertension, renal disease, stroke.   patient admitted for worsening dementia, UTI  Assessment and plan Worsening dementia Presents with confusion with environmental changes, lethargy, altered mental status in the setting of acute kidney injury and UTI Patient arousable to voice stimulation, confused at baseline Ammonia within normal limits Treat underlying causes Dysphagia 2 (Fine chop);Thin liquid Discussed with patient's daughter, with requesting physical therapy documentation to be faxed to the nursing home to determine if she is able to go back there    UTI with a positive urine culture continue Rocephin to see his mental status improves Change to [facility] when ready to leave the hospital  Hypertension continue metoprolol,   gout continue allopurinol,   history of seizure disorder continue Keppra  Skin ulcer Left posterior leg with full thickness wound; 20% dark red-brown, 80% red, 9X8X.2cm -The ulceration on the back of her leg is likely exacerbated by immobility - appreciate wound care consult and recommendations    HTN -Continue Lopressor  DM -hold Glucophage Continue SSI   DVT prophylaxsis Lovenox  Code Status:  Full code       Family Communication: Discussed in detail with the patient, all imaging results, lab results explained to the patient /daughter  Disposition Plan:  Continue to treat UTI,  back to SNF when bed available, discussed plan with the patient's daughter      Consultants:  neurology  Procedures:  none  Antibiotics: Anti-infectives (From admission, onward)   Start     Dose/Rate Route Frequency Ordered Stop   07/19/18 1300  cefTRIAXone (ROCEPHIN) 1 g in sodium chloride 0.9 % 100 mL IVPB     1 g 200 mL/hr over 30 Minutes Intravenous Every 24 hours 07/19/18 1228     07/18/18 1145  ceFAZolin (ANCEF) IVPB 1 g/50 mL premix     1 g 100 mL/hr over 30 Minutes Intravenous  Once 07/18/18 1144 07/18/18 1235         HPI/Subjective:  remains confused but arousable  Objective: Vitals:   07/19/18 0449 07/19/18 1400 07/19/18 2101 07/20/18 0522  BP: (!) 157/94 (!) 171/60 (!) 191/68 (!) 169/68  Pulse: 73 72 73 65  Resp: 17 20 19 16   Temp: (!) 97.5 F (36.4 C) 98 F (36.7 C) 98.4 F (36.9 C) 98.4 F (36.9 C)  TempSrc: Oral Oral Oral Oral  SpO2: 100% 98% 100% 98%  Weight:      Height:        Intake/Output Summary (Last 24 hours) at 07/20/2018 1305 Last data filed at 07/20/2018 0444 Gross per 24 hour  Intake 1793.28 ml  Output 1000 ml  Net 793.28 ml    Exam:  Examination:  General exam: confused Respiratory system: Clear to auscultation. Respiratory effort normal. Cardiovascular system: S1 & S2 heard, RRR. No JVD, murmurs, rubs, gallops or clicks. No pedal edema. Gastrointestinal system: Abdomen is nondistended, soft and nontender. No organomegaly or masses felt. Normal bowel sounds heard. Central nervous system:  Remains confused but arousable Extremities: Symmetric 5 x 5 power. Skin: No rashes, lesions or ulcers Psychiatry: confused but arousable  Data Reviewed: I have personally reviewed following labs and imaging studies  Micro Results Recent Results (from the past 240 hour(s))  Culture, blood (routine x 2)     Status: Abnormal (Preliminary result)   Collection Time: 07/18/18 10:10 AM  Result Value Ref Range Status   Specimen Description BLOOD RIGHT ANTECUBITAL  Final   Special Requests   Final    BOTTLES DRAWN  AEROBIC AND ANAEROBIC Blood Culture adequate volume   Culture  Setup Time   Final    GRAM POSITIVE COCCI AEROBIC BOTTLE ONLY Organism ID to follow CRITICAL RESULT CALLED TO, READ BACK BY AND VERIFIED WITH: G ABBOTT PHARMD 0518 07/19/18 A BROWNING    Culture (A)  Final    STAPHYLOCOCCUS SPECIES (COAGULASE NEGATIVE) THE SIGNIFICANCE OF ISOLATING THIS ORGANISM FROM A SINGLE SET OF BLOOD CULTURES WHEN MULTIPLE SETS ARE DRAWN IS UNCERTAIN. PLEASE NOTIFY THE MICROBIOLOGY DEPARTMENT WITHIN ONE WEEK IF SPECIATION AND SENSITIVITIES ARE REQUIRED. Performed at [facility], [address], [place], [place] [address]    Report Status PENDING  Incomplete  Blood Culture ID Panel (Reflexed)     Status: Abnormal   Collection Time: 07/18/18 10:10 AM  Result Value Ref Range Status   Enterococcus species NOT DETECTED NOT DETECTED Final   Listeria monocytogenes NOT DETECTED NOT DETECTED Final   Staphylococcus species DETECTED (A) NOT DETECTED Final    Comment: Methicillin (oxacillin) resistant coagulase negative staphylococcus. Possible blood culture contaminant (unless isolated from more than one blood culture draw or clinical case suggests pathogenicity). No antibiotic treatment is indicated for blood  culture contaminants. CRITICAL RESULT CALLED TO, READ BACK BY AND VERIFIED WITH: G ABBOTT PHARMD 0518 07/19/18 A BROWNING    Staphylococcus aureus NOT DETECTED NOT DETECTED Final   Methicillin resistance DETECTED (A) NOT DETECTED Final    Comment: CRITICAL RESULT CALLED TO, READ BACK BY AND VERIFIED WITH: G ABBOTT PHARMD 0518 07/19/18 A BROWNING    Streptococcus species NOT DETECTED NOT DETECTED Final   Streptococcus agalactiae NOT DETECTED NOT DETECTED Final   Streptococcus pneumoniae NOT DETECTED NOT DETECTED Final   Streptococcus pyogenes NOT DETECTED NOT DETECTED Final   Acinetobacter baumannii NOT DETECTED NOT DETECTED Final   Enterobacteriaceae species NOT DETECTED NOT DETECTED Final    Enterobacter cloacae complex NOT DETECTED NOT DETECTED Final   Escherichia coli NOT DETECTED NOT DETECTED Final   Klebsiella oxytoca NOT DETECTED NOT DETECTED Final   Klebsiella pneumoniae NOT DETECTED NOT DETECTED Final   Proteus species NOT DETECTED NOT DETECTED Final   Serratia marcescens NOT DETECTED NOT DETECTED Final   Haemophilus influenzae NOT DETECTED NOT DETECTED Final   Neisseria meningitidis NOT DETECTED NOT DETECTED Final   Pseudomonas aeruginosa NOT DETECTED NOT DETECTED Final   Candida albicans NOT DETECTED NOT DETECTED Final   Candida glabrata NOT DETECTED NOT DETECTED Final   Candida krusei NOT DETECTED NOT DETECTED Final   Candida parapsilosis NOT DETECTED NOT DETECTED Final   Candida tropicalis NOT DETECTED NOT DETECTED Final    Comment: Performed at [facility], [address], [place], [place] [address]  Urine culture     Status: Abnormal   Collection Time: 07/18/18 10:21 AM  Result Value Ref Range Status   Specimen Description URINE, RANDOM  Final   Special Requests   Final    NONE Performed at [facility], [address], [place], [place] [address]    Culture 50,000 COLONIES/mL ESCHERICHIA COLI (A)  Final   Report  Status 07/20/2018 FINAL  Final   Organism ID, Bacteria ESCHERICHIA COLI (A)  Final      Susceptibility   Escherichia coli - MIC*    AMPICILLIN 8 SENSITIVE Sensitive     CEFAZOLIN <=4 SENSITIVE Sensitive     CEFTRIAXONE <=1 SENSITIVE Sensitive     CIPROFLOXACIN >=4 RESISTANT Resistant     GENTAMICIN <=1 SENSITIVE Sensitive     IMIPENEM <=0.25 SENSITIVE Sensitive     NITROFURANTOIN <=16 SENSITIVE Sensitive     TRIMETH/SULFA <=20 SENSITIVE Sensitive     AMPICILLIN/SULBACTAM 4 SENSITIVE Sensitive     PIP/TAZO <=4 SENSITIVE Sensitive     Extended ESBL NEGATIVE Sensitive     * 50,000 COLONIES/mL ESCHERICHIA COLI  Culture, blood (routine x 2)     Status: None (Preliminary result)   Collection Time: 07/18/18 10:50 AM  Result Value Ref  Range Status   Specimen Description BLOOD BLOOD RIGHT ARM  Final   Special Requests   Final    BOTTLES DRAWN AEROBIC AND ANAEROBIC Blood Culture results may not be optimal due to an excessive volume of blood received in culture bottles   Culture   Final    NO GROWTH 2 DAYS Performed at [facility], [address], [place], [place] [address]    Report Status PENDING  Incomplete    Radiology Reports Ct Head Wo Contrast  Result Date: 07/18/2018 CLINICAL DATA:  Altered level of consciousness, unexplained. Patient is now unresponsive. Recent UTI. EXAM: CT HEAD WITHOUT CONTRAST TECHNIQUE: Contiguous axial images were obtained from the base of the skull through the vertex without intravenous contrast. COMPARISON:  MRI brain 06/26/2018. CT head without contrast 06/26/2018. FINDINGS: Brain: Advanced atrophy and diffuse white matter changes are again seen. Remote lacunar infarcts of the basal ganglia are stable. No acute infarct, hemorrhage, or mass lesion is present. The ventricles are proportionate to the degree of atrophy. Brainstem and cerebellum within limits. Vascular: Atherosclerotic calcifications are present in the cavernous internal carotid arteries bilaterally and at the dural margin of both vertebral arteries. There is no hyperdense vessel. Skull: Calvarium is intact. Hyperostosis is noted. No focal lytic or blastic lesions are present. No significant extracranial soft tissue lesions are present. Sinuses/Orbits: The paranasal sinuses and mastoid air cells are clear. Bilateral lens replacements are present. Globes and orbits are otherwise within limits. IMPRESSION: 1. No acute intracranial abnormality. 2. Stable advanced atrophy and diffuse white matter disease. This likely reflects the sequela of chronic microvascular ischemia. 3. Stable remote lacunar infarcts of the basal ganglia bilaterally. 4. Atherosclerosis. Electronically Signed   By: Marin Roberts M.D.   On: 07/18/2018 11:00    Mr Brain Wo Contrast  Result Date: 06/26/2018 CLINICAL DATA:  84 year old female code stroke, altered mental status, slurred speech, left facial droop. EXAM: MRI HEAD WITHOUT CONTRAST TECHNIQUE: Multiplanar, multiecho pulse sequences of the brain and surrounding structures were obtained without intravenous contrast. COMPARISON:  Head CT without contrast 0131 hours today. Head face and cervical spine CT 12/01/2017. FINDINGS: Brain: No restricted diffusion or evidence of acute infarction. Extensive T2 heterogeneity in the bilateral deep gray matter nuclei appears to reflect a combination of dilated perivascular spaces and chronic lacunes (right caudate and are thalamus). There is a small chronic lacune in the left paracentral pons. There is a tiny chronic infarct in the right cerebellum (series 7, image 9). Patchy and confluent bilateral cerebral white matter T2 and FLAIR hyperintensity. Occasional chronic micro hemorrhages in the brain, including the left cerebellum  on series 9, image 19. No midline shift, mass effect, evidence of mass lesion, ventriculomegaly, extra-axial collection or acute intracranial hemorrhage. Cervicomedullary junction and pituitary are within normal limits. Vascular: Major intracranial vascular flow voids are preserved. Skull and upper cervical spine: Widespread cervical spine degeneration with mild reversal of lordosis. Hyperostosis of the calvarium. Visible bone marrow signal remains within normal limits. Sinuses/Orbits: Postoperative changes to both globes. Otherwise normal orbits soft tissues. Paranasal sinuses and mastoids are stable and well pneumatized. Other: Grossly normal visible internal auditory structures. Scalp and face soft tissues appear negative. IMPRESSION: 1.  No acute intracranial abnormality. 2. Moderate to advanced signal changes in the brain compatible with chronic small vessel disease. Electronically Signed   By: Odessa Fleming M.D.   On: 06/26/2018 08:42   Dg Chest  Portable 1 View  Result Date: 07/18/2018 CLINICAL DATA:  Altered mental status EXAM: PORTABLE CHEST 1 VIEW COMPARISON:  June 26, 2018 FINDINGS: There is no edema or consolidation. Heart is upper normal in size with pulmonary vascularity normal. There is aortic atherosclerosis. No evident adenopathy. No bone lesions. IMPRESSION: Aortic atherosclerosis. No edema or consolidation. Stable cardiac silhouette. Aortic Atherosclerosis (ICD10-I70.0). Electronically Signed   By: Bretta Bang III M.D.   On: 07/18/2018 10:43   Dg Chest Portable 1 View  Result Date: 06/26/2018 CLINICAL DATA:  Altered mental status EXAM: PORTABLE CHEST 1 VIEW COMPARISON:  None. FINDINGS: Cardiac shadows within normal limits. Aortic calcifications are seen. The lungs are well aerated bilaterally. No focal infiltrate or sizable effusion is noted. No bony abnormality is seen. IMPRESSION: No active disease. Electronically Signed   By: Alcide Clever M.D.   On: 06/26/2018 01:53   Ct Head Code Stroke Wo Contrast  Result Date: 06/26/2018 CLINICAL DATA:  Code stroke. Left-sided facial droop, right-sided gaze, right dilated pupil. EXAM: CT HEAD WITHOUT CONTRAST TECHNIQUE: Contiguous axial images were obtained from the base of the skull through the vertex without intravenous contrast. COMPARISON:  12/01/2017 CT head FINDINGS: Brain: No evidence of acute infarction, hemorrhage, hydrocephalus, extra-axial collection or mass lesion/mass effect. Advanced chronic microvascular ischemic changes, volume loss of the brain, small chronic infarct in left hemi pons, and multiple small chronic infarcts in the bilateral basal ganglia are stable in comparison with prior CT of the head. Vascular: Calcific atherosclerosis of the carotid siphons and vertebral arteries. No hyperdense vessel identified. Skull: Normal. Negative for fracture or focal lesion. Sinuses/Orbits: No acute finding. Other: None. ASPECTS [facility] Stroke Program Early CT Score) -  Ganglionic level infarction (caudate, lentiform nuclei, internal capsule, insula, M1-M3 cortex): 7 - Supraganglionic infarction (M4-M6 cortex): 3 Total score (0-10 with 10 being normal): 10 IMPRESSION: 1. No acute intracranial abnormality identified. 2. ASPECTS is 10 3. Stable advanced chronic microvascular ischemic changes, volume loss of the brain, and multiple small chronic infarcts in the basal ganglia as well as pons. These results were communicated to Dr. Laurence Slate at 1:40 amon 8/12/2019by text page via the [facility] messaging system. Electronically Signed   By: Mitzi Hansen M.D.   On: 06/26/2018 01:41     CBC Recent Labs  Lab 07/18/18 1010 07/19/18 0542  WBC 9.5 6.8  HGB 11.0* 11.1*  HCT 36.4 37.0  PLT 118* 86*  MCV 110.0* 110.1*  MCH 33.2 33.0  MCHC 30.2 30.0  RDW 14.4 14.3  LYMPHSABS 2.2  --   MONOABS 0.5  --   EOSABS 0.4  --   BASOSABS 0.0  --     Chemistries  Recent Labs  Lab 07/18/18 1010 07/18/18 1138 07/19/18 0542  NA 141  --  139  K 5.4* 5.4* 4.9  CL 105  --  106  CO2 26  --  25  GLUCOSE 107*  --  98  BUN 32*  --  28*  CREATININE 1.72*  --  1.40*  CALCIUM 8.7*  --  8.9  AST 13*  --   --   ALT 9  --   --   ALKPHOS 60  --   --   BILITOT 0.6  --   --    ------------------------------------------------------------------------------------------------------------------ estimated creatinine clearance is 24.6 mL/min (A) (by C-G formula based on SCr of 1.4 mg/dL (H)). ------------------------------------------------------------------------------------------------------------------ Recent Labs    07/19/18 0542  HGBA1C 5.6   ------------------------------------------------------------------------------------------------------------------ No results for input(s): CHOL, HDL, LDLCALC, TRIG, CHOLHDL, LDLDIRECT in the last 72 hours. ------------------------------------------------------------------------------------------------------------------ No results for  input(s): TSH, T4TOTAL, T3FREE, THYROIDAB in the last 72 hours.  Invalid input(s): FREET3 ------------------------------------------------------------------------------------------------------------------ No results for input(s): VITAMINB12, FOLATE, FERRITIN, TIBC, IRON, RETICCTPCT in the last 72 hours.  Coagulation profile No results for input(s): INR, PROTIME in the last 168 hours.  No results for input(s): DDIMER in the last 72 hours.  Cardiac Enzymes No results for input(s): CKMB, TROPONINI, MYOGLOBIN in the last 168 hours.  Invalid input(s): CK ------------------------------------------------------------------------------------------------------------------ Invalid input(s): POCBNP   CBG: Recent Labs  Lab 07/19/18 1137 07/19/18 1640 07/19/18 2055 07/20/18 0829 07/20/18 1212  GLUCAP 99 86 100* 119* 126*       Studies: No results found.    Lab Results  Component Value Date   HGBA1C 5.6 07/19/2018   HGBA1C 5.5 06/26/2018   Lab Results  Component Value Date   LDLCALC 106 (H) 06/26/2018   CREATININE 1.40 (H) 07/19/2018       Scheduled Meds: . allopurinol  100 mg Oral Daily  . aspirin  325 mg Oral Daily  . docusate sodium  100 mg Oral BID  . enoxaparin (LOVENOX) injection  30 mg Subcutaneous Q24H  . insulin aspart  0-15 Units Subcutaneous TID WC  . metoprolol tartrate  25 mg Oral Daily  . mupirocin cream   Topical Daily   Continuous Infusions: . cefTRIAXone (ROCEPHIN)  IV 1 g (07/20/18 1300)  . dextrose 5 % and 0.9% NaCl 75 mL/hr at 07/20/18 0944  . levETIRAcetam 500 mg (07/20/18 0148)     LOS: 2 days    Time spent: >30 MINS    Richarda Overlie  [facility] Pager [phone]. If 7PM-7AM, please contact night-coverage at [email or web address], password [facility] 07/20/2018, 1:05 PM  LOS: 2 days

## 2018-07-20 NOTE — NC FL2 (Signed)
[place] MEDICAID FL2 LEVEL OF CARE SCREENING TOOL     IDENTIFICATION  Patient Name: Judith Turner Birthdate: 10-22-1923 Sex: female Admission Date (Current Location): 07/18/2018  [facility] and [place] Number:  Producer, television/film/video and Address:  [facility] [place], [address], [place], [place] [address]      Provider Number: [id number]  Attending Physician Name and Address:  Richarda Overlie, MD  Relative Name and Phone Number:  Elinda Mo; daughter; [phone]    Current Level of Care: Hospital Recommended Level of Care: Skilled Nursing Facility Prior Approval Number:    Date Approved/Denied:   PASRR Number:    Discharge Plan: SNF    Current Diagnoses: Patient Active Problem List   Diagnosis Date Noted  . Altered mental status 07/18/2018  . Essential hypertension 07/18/2018  . Acute renal failure (ARF) (HCC) 07/18/2018  . Hypertensive urgency 06/26/2018  . Acute metabolic encephalopathy 06/26/2018  . AKI (acute kidney injury) (HCC) 06/26/2018  . Skin ulcer-left leg 06/26/2018  . UTI (urinary tract infection) 06/26/2018  . COPD (chronic obstructive pulmonary disease) (HCC)   . Diabetes mellitus without complication (HCC)   . Gout   . Stroke (HCC)   . Hyperlipidemia     Orientation RESPIRATION BLADDER Height & Weight     Self  Normal Incontinent, External catheter Weight: 153 lb 10.6 oz (69.7 kg) Height:  5\' 4"  (162.6 cm)  BEHAVIORAL SYMPTOMS/MOOD NEUROLOGICAL BOWEL NUTRITION STATUS      Incontinent Diet(see discharge summary)  AMBULATORY STATUS COMMUNICATION OF NEEDS Skin   Extensive Assist Verbally Other (Comment)(MASD on perineum; venous stasis ulcer on leg with foam)                       Personal Care Assistance Level of Assistance  Bathing, Feeding, Dressing Bathing Assistance: Maximum assistance Feeding assistance: Limited assistance Dressing Assistance: Maximum assistance     Functional Limitations Info  Sight, Hearing,  Speech Sight Info: Adequate Hearing Info: Adequate Speech Info: Adequate    SPECIAL CARE FACTORS FREQUENCY  PT (By licensed PT), OT (By licensed OT)     PT Frequency: 5x week OT Frequency: 5x week            Contractures Contractures Info: Not present    Additional Factors Info  Code Status, Allergies Code Status Info: DNR Allergies Info: No Known Allergies   Insulin Sliding Scale Info: 0-9 units every 4 hours       Current Medications (07/20/2018):  This is the current hospital active medication list Current Facility-Administered Medications  Medication Dose Route Frequency Provider Last Rate Last Dose  . acetaminophen (TYLENOL) tablet 650 mg  650 mg Oral Q6H PRN Jonah Blue, MD       Or  . acetaminophen (TYLENOL) suppository 650 mg  650 mg Rectal Q6H PRN Jonah Blue, MD      . allopurinol (ZYLOPRIM) tablet 100 mg  100 mg Oral Daily Jonah Blue, MD   100 mg at 07/20/18 6438  . aspirin tablet 325 mg  325 mg Oral Daily Jonah Blue, MD   325 mg at 07/20/18 3818  . cefTRIAXone (ROCEPHIN) 1 g in sodium chloride 0.9 % 100 mL IVPB  1 g Intravenous Q24H Richarda Overlie, MD   Stopped at 07/19/18 1328  . dextrose 5 %-0.9 % sodium chloride infusion   Intravenous Continuous Richarda Overlie, MD 75 mL/hr at 07/20/18 0944    . docusate sodium (COLACE) capsule 100 mg  100 mg  Oral BID Jonah Blue, MD   Stopped at 07/19/18 1022  . enoxaparin (LOVENOX) injection 30 mg  30 mg Subcutaneous Q24H Titus Mould, RPH   30 mg at 07/19/18 1416  . HYDROcodone-acetaminophen (NORCO/VICODIN) 5-325 MG per tablet 1 tablet  1 tablet Oral Q4H PRN Jonah Blue, MD      . insulin aspart (novoLOG) injection 0-15 Units  0-15 Units Subcutaneous TID WC Abrol, Germain Osgood, MD      . levETIRAcetam (KEPPRA) IVPB 500 mg/100 mL premix  500 mg Intravenous Steva Colder, MD 400 mL/hr at 07/20/18 0148 500 mg at 07/20/18 0148  . metoprolol tartrate (LOPRESSOR) tablet 25 mg  25 mg Oral Daily Jonah Blue, MD   25 mg at 07/20/18 4098  . mupirocin cream (BACTROBAN) 2 %   Topical Daily Richarda Overlie, MD      . ondansetron (ZOFRAN) tablet 4 mg  4 mg Oral Q6H PRN Jonah Blue, MD       Or  . ondansetron [facility]) injection 4 mg  4 mg Intravenous Q6H PRN Jonah Blue, MD         Discharge Medications: Please see discharge summary for a list of discharge medications.  Relevant Imaging Results:  Relevant Lab Results:   Additional Information SS#[id number] [place], [place]

## 2018-07-21 LAB — CULTURE, BLOOD (ROUTINE X 2): SPECIAL REQUESTS: ADEQUATE

## 2018-07-21 LAB — GLUCOSE, CAPILLARY
GLUCOSE-CAPILLARY: 122 mg/dL — AB (ref 70–99)
GLUCOSE-CAPILLARY: 159 mg/dL — AB (ref 70–99)
GLUCOSE-CAPILLARY: 109 mg/dL — AB (ref 70–99)
Glucose-Capillary: 134 mg/dL — ABNORMAL HIGH (ref 70–99)

## 2018-07-21 MED ORDER — AMLODIPINE BESYLATE 5 MG PO TABS
5.0000 mg | ORAL_TABLET | Freq: Every day | ORAL | Status: DC
  Administered 2018-07-21 – 2018-07-22 (×2): 5 mg via ORAL
  Filled 2018-07-21 (×2): qty 1

## 2018-07-21 NOTE — Care Management Important Message (Signed)
Important Message  Patient Details  Name: Tomica Mondo MRN: [id number] Date of Birth: 24-Oct-1923   Medicare Important Message Given:  No  Due to illness patient was not able to sign.  Kylan Veach 07/21/2018, 3:15 PM

## 2018-07-21 NOTE — Evaluation (Signed)
Occupational Therapy Evaluation Patient Details Name: Judith Turner MRN: [id number] DOB: Dec 14, 1922 Today's Date: 07/21/2018    History of Present Illness Patient is a 84 y/o female presenting to the ED on 07/18/18 with AMS. Of note, recent admission 8/12-15 for similar condition. PMH significant for CVA; CKD; HTN; HLD; DM; and COPD.    Clinical Impression   PTA patient reports residing at [facility], she reports receiving PT and OT at SNF and being able to walk using RW with a little help as well as complete self care with a little help. She currently requires min assist for bed mobility, mod assist for transfers, min assist for UB ADL, and mod to maximal assist for LB ADL.  She is limited by decreased activity tolerance, generalized weakness, impaired balance, and cognition (orientation, attention, problem solving, memory).  She will benefit from continued OT services while admitted and continued SNF rehab at dc in order to maximize safety, ADLs and transfers/mobility.  Will continue to follow.      Follow Up Recommendations  SNF;Supervision/Assistance - 24 hour    Equipment Recommendations  Other (comment)(TBD at next venue of care)    Recommendations for Other Services       Precautions / Restrictions Precautions Precautions: Fall Restrictions Weight Bearing Restrictions: No      Mobility Bed Mobility Overal bed mobility: Needs Assistance Bed Mobility: Supine to Sit;Sit to Supine     Supine to sit: Min assist Sit to supine: Min assist   General bed mobility comments: min assist for support to ascend trunk, increased time required and verbal cueing   Transfers Overall transfer level: Needs assistance Equipment used: Rolling walker (2 wheeled) Transfers: Sit to/from Stand Sit to Stand: Mod assist         General transfer comment: mod assist to ascend into standing initally, min assist to scoot boost hips to change linen    Balance Overall balance assessment: Needs  assistance Sitting-balance support: No upper extremity supported;Feet supported Sitting balance-Leahy Scale: Fair     Standing balance support: Bilateral upper extremity supported;During functional activity Standing balance-Leahy Scale: Poor Standing balance comment: reliant on external support                           ADL either performed or assessed with clinical judgement   ADL Overall ADL's : Needs assistance/impaired Eating/Feeding: Set up;Supervision/ safety;Bed level   Grooming: Set up;Supervision/safety;Sitting   Upper Body Bathing: Set up;Supervision/ safety;Sitting   Lower Body Bathing: Moderate assistance;Sit to/from stand   Upper Body Dressing : Minimal assistance;Sitting   Lower Body Dressing: Sit to/from stand;Maximal assistance   Toilet Transfer: Moderate assistance;Ambulation;RW(simulated in room)   Toileting- Clothing Manipulation and Hygiene: Total assistance;Sit to/from stand       Functional mobility during ADLs: Minimal assistance;Rolling walker General ADL Comments: Pt limited by decreased activity tolerance, generalized weakness, and cognition.      Vision   Additional Comments: difficult to assess, but no functional impairments noted today     Perception     Praxis      Pertinent Vitals/Pain Pain Assessment: Faces Faces Pain Scale: No hurt     Hand Dominance Right   Extremity/Trunk Assessment Upper Extremity Assessment Upper Extremity Assessment: Generalized weakness   Lower Extremity Assessment Lower Extremity Assessment: Defer to PT evaluation   Cervical / Trunk Assessment Cervical / Trunk Assessment: Kyphotic   Communication Communication Communication: No difficulties   Cognition Arousal/Alertness: Awake/alert Behavior During Therapy: Flat  affect Overall Cognitive Status: No family/caregiver present to determine baseline cognitive functioning Area of Impairment: Orientation;Attention;Memory;Following  commands;Safety/judgement;Awareness;Problem solving                 Orientation Level: Disoriented to;Place;Situation Current Attention Level: Sustained Memory: Decreased short-term memory Following Commands: Follows one step commands inconsistently;Follows one step commands with increased time Safety/Judgement: Decreased awareness of safety;Decreased awareness of deficits Awareness: Intellectual Problem Solving: Slow processing;Decreased initiation;Requires verbal cues     General Comments  VSS    Exercises     Shoulder Instructions      Home Living Family/patient expects to be discharged to:: Skilled nursing facility     Type of Home: Skilled Nursing Facility                           Additional Comments: per patient she was at SNF as recieving therapy, PT able to speak to daughter yesterday per chart review      Prior Functioning/Environment Level of Independence: Needs assistance  Gait / Transfers Assistance Needed: min A with RW ADL's / Homemaking Assistance Needed: patient reports requiring some assistance with ADLs   Comments: unsure of reliability of patient due to AMS         OT Problem List: Decreased strength;Decreased activity tolerance;Impaired balance (sitting and/or standing);Decreased safety awareness;Decreased cognition;Decreased knowledge of use of DME or AE;Decreased knowledge of precautions      OT Treatment/Interventions: Self-care/ADL training;Therapeutic exercise;Patient/family education;Balance training;Energy conservation;Therapeutic activities;DME and/or AE instruction;Cognitive remediation/compensation    OT Goals(Current goals can be found in the care plan section) Acute Rehab OT Goals Patient Stated Goal: to get stronger and be able to walk again OT Goal Formulation: With patient Time For Goal Achievement: 08/04/18 Potential to Achieve Goals: Fair  OT Frequency: Min 2X/week   Barriers to D/C:             Co-evaluation              AM-PAC PT "6 Clicks" Daily Activity     Outcome Measure Help from another person eating meals?: A Little Help from another person taking care of personal grooming?: A Little Help from another person toileting, which includes using toliet, bedpan, or urinal?: Total Help from another person bathing (including washing, rinsing, drying)?: A Lot Help from another person to put on and taking off regular upper body clothing?: A Little Help from another person to put on and taking off regular lower body clothing?: A Lot 6 Click Score: 14   End of Session Equipment Utilized During Treatment: Gait belt;Rolling walker Nurse Communication: Mobility status;Other (comment)(pt needs)  Activity Tolerance: Patient tolerated treatment well Patient left: in bed;with call bell/phone within reach;with bed alarm set  OT Visit Diagnosis: Other abnormalities of gait and mobility (R26.89);Muscle weakness (generalized) (M62.81);History of falling (Z91.81);Other symptoms and signs involving cognitive function                Time: 1610-9604 OT Time Calculation (min): 27 min Charges:  OT General Charges $OT Visit: 1 Visit OT Evaluation $OT Eval Moderate Complexity: 1 Mod OT Treatments $Self Care/Home Management : 8-22 mins  Chancy Milroy, OT Acute Rehabilitation Services Pager [phone] Office [phone]   Chancy Milroy 07/21/2018, 11:17 AM

## 2018-07-21 NOTE — Progress Notes (Signed)
Pt BP was 193/73 given norvasc and rechecked BP 153/70, asymptomatic, alert and responsive no complain of distress noted.

## 2018-07-21 NOTE — Progress Notes (Addendum)
[facility] PROGRESS NOTE  Judith Turner WUJ:[id number] DOB: July 16, 1923 DOA: 07/18/2018   PCP: System, Pcp Not In     Assessment/Plan: Principal Problem:   Altered mental status Active Problems:   Diabetes mellitus without complication (HCC)   Skin ulcer-left leg   Essential hypertension   Acute renal failure (ARF) (HCC)   84 y.o. female with history of COPD, diabetes, gout, hyperlipidemia, hypertension, renal disease, stroke.   patient admitted for worsening dementia, UTI. Improved after being treated with antibiotics, now awaiting placement  Assessment and plan Worsening dementia Presents with confusion with environmental changes, lethargy, altered mental status in the setting of acute kidney injury and UTI Patient arousable to voice stimulation, confused at baseline Ammonia within normal limits Treat underlying causes as below Dysphagia 2 (Fine chop);Thin liquid Discussed with patient's daughter,  She is interested in skilled nursing placement for rehabilitation Currently patient is a 2 person assist and appears to be eligible for SNF I did peer to peer review with insurance company   UTI with a positive urine culture Treated with Rocephin , mental status is improved Changed  to  Keflex for wound of the leg and UTI  Left posterior leg wound Left posterior leg with full thickness wound; 20% dark red-brown, 80% red, 9X8X.2cm small amt pink drainage, no odor Wound care recommends Bactroban to promote moist healing, foam dressing to protect from further injury.   Hypertension continue metoprolol, added Norvasc due to uncontrolled blood pressure today  gout continue allopurinol,   history of seizure disorder continue Keppra   DM -hold Glucophage Continue SSI, hemoglobin A1c 5.6   DVT prophylaxsis Lovenox  Code Status:  Full code       Family Communication: Discussed in detail with the patient, all imaging results, lab results explained to the  patient /daughter  Disposition Plan:  Continue to treat UTI,  back to SNF when bed available, discussed plan with the patient's daughter     Consultants:  neurology  Procedures:  none  Antibiotics: Anti-infectives (From admission, onward)   Start     Dose/Rate Route Frequency Ordered Stop   07/21/18 1000  cephALEXin (KEFLEX) capsule 250 mg     250 mg Oral Every 12 hours 07/20/18 1430 07/23/18 0959   07/19/18 1300  cefTRIAXone (ROCEPHIN) 1 g in sodium chloride 0.9 % 100 mL IVPB  Status:  Discontinued     1 g 200 mL/hr over 30 Minutes Intravenous Every 24 hours 07/19/18 1228 07/20/18 1430   07/18/18 1145  ceFAZolin (ANCEF) IVPB 1 g/50 mL premix     1 g 100 mL/hr over 30 Minutes Intravenous  Once 07/18/18 1144 07/18/18 1235         HPI/Subjective: Patient noted to be hypertensive overnight, denies any chest pain shortness of breath although history is not reliable  Objective: Vitals:   07/20/18 2330 07/20/18 2356 07/21/18 0142 07/21/18 0501  BP: (!) 224/75 (!) 211/73 (!) 169/72 (!) 168/63  Pulse: 74 76 78 68  Resp:    20  Temp: 97.6 F (36.4 C)   97.7 F (36.5 C)  TempSrc: Oral   Oral  SpO2: 95%   96%  Weight:      Height:        Intake/Output Summary (Last 24 hours) at 07/21/2018 1435 Last data filed at 07/21/2018 1104 Gross per 24 hour  Intake 2369.9 ml  Output 900 ml  Net 1469.9 ml    Exam:  Examination:  General exam:  confused Respiratory system: Clear to auscultation. Respiratory effort normal. Cardiovascular system: S1 & S2 heard, RRR. No JVD, murmurs, rubs, gallops or clicks. No pedal edema. Gastrointestinal system: Abdomen is nondistended, soft and nontender. No organomegaly or masses felt. Normal bowel sounds heard. Central nervous system:  Remains confused but arousable Extremities: Symmetric 5 x 5 power. Skin: No rashes, lesions or ulcers Psychiatry: confused but arousable     Data Reviewed: I have personally reviewed following labs and  imaging studies  Micro Results Recent Results (from the past 240 hour(s))  Culture, blood (routine x 2)     Status: Abnormal   Collection Time: 07/18/18 10:10 AM  Result Value Ref Range Status   Specimen Description BLOOD RIGHT ANTECUBITAL  Final   Special Requests   Final    BOTTLES DRAWN AEROBIC AND ANAEROBIC Blood Culture adequate volume   Culture  Setup Time   Final    GRAM POSITIVE COCCI AEROBIC BOTTLE ONLY Organism ID to follow CRITICAL RESULT CALLED TO, READ BACK BY AND VERIFIED WITH: G ABBOTT PHARMD 0518 07/19/18 A BROWNING    Culture (A)  Final    STAPHYLOCOCCUS SPECIES (COAGULASE NEGATIVE) THE SIGNIFICANCE OF ISOLATING THIS ORGANISM FROM A SINGLE SET OF BLOOD CULTURES WHEN MULTIPLE SETS ARE DRAWN IS UNCERTAIN. PLEASE NOTIFY THE MICROBIOLOGY DEPARTMENT WITHIN ONE WEEK IF SPECIATION AND SENSITIVITIES ARE REQUIRED. Performed at [facility], [address], [place], [place] [address]    Report Status 07/21/2018 FINAL  Final  Blood Culture ID Panel (Reflexed)     Status: Abnormal   Collection Time: 07/18/18 10:10 AM  Result Value Ref Range Status   Enterococcus species NOT DETECTED NOT DETECTED Final   Listeria monocytogenes NOT DETECTED NOT DETECTED Final   Staphylococcus species DETECTED (A) NOT DETECTED Final    Comment: Methicillin (oxacillin) resistant coagulase negative staphylococcus. Possible blood culture contaminant (unless isolated from more than one blood culture draw or clinical case suggests pathogenicity). No antibiotic treatment is indicated for blood  culture contaminants. CRITICAL RESULT CALLED TO, READ BACK BY AND VERIFIED WITH: G ABBOTT PHARMD 0518 07/19/18 A BROWNING    Staphylococcus aureus NOT DETECTED NOT DETECTED Final   Methicillin resistance DETECTED (A) NOT DETECTED Final    Comment: CRITICAL RESULT CALLED TO, READ BACK BY AND VERIFIED WITH: G ABBOTT PHARMD 0518 07/19/18 A BROWNING    Streptococcus species NOT DETECTED NOT DETECTED Final    Streptococcus agalactiae NOT DETECTED NOT DETECTED Final   Streptococcus pneumoniae NOT DETECTED NOT DETECTED Final   Streptococcus pyogenes NOT DETECTED NOT DETECTED Final   Acinetobacter baumannii NOT DETECTED NOT DETECTED Final   Enterobacteriaceae species NOT DETECTED NOT DETECTED Final   Enterobacter cloacae complex NOT DETECTED NOT DETECTED Final   Escherichia coli NOT DETECTED NOT DETECTED Final   Klebsiella oxytoca NOT DETECTED NOT DETECTED Final   Klebsiella pneumoniae NOT DETECTED NOT DETECTED Final   Proteus species NOT DETECTED NOT DETECTED Final   Serratia marcescens NOT DETECTED NOT DETECTED Final   Haemophilus influenzae NOT DETECTED NOT DETECTED Final   Neisseria meningitidis NOT DETECTED NOT DETECTED Final   Pseudomonas aeruginosa NOT DETECTED NOT DETECTED Final   Candida albicans NOT DETECTED NOT DETECTED Final   Candida glabrata NOT DETECTED NOT DETECTED Final   Candida krusei NOT DETECTED NOT DETECTED Final   Candida parapsilosis NOT DETECTED NOT DETECTED Final   Candida tropicalis NOT DETECTED NOT DETECTED Final    Comment: Performed at [facility], [address], [place],  [place] [address]  Urine culture     Status: Abnormal   Collection Time: 07/18/18 10:21 AM  Result Value Ref Range Status   Specimen Description URINE, RANDOM  Final   Special Requests   Final    NONE Performed at [facility], [address] [place], [place] [address]    Culture 50,000 COLONIES/mL ESCHERICHIA COLI (A)  Final   Report Status 07/20/2018 FINAL  Final   Organism ID, Bacteria ESCHERICHIA COLI (A)  Final      Susceptibility   Escherichia coli - MIC*    AMPICILLIN 8 SENSITIVE Sensitive     CEFAZOLIN <=4 SENSITIVE Sensitive     CEFTRIAXONE <=1 SENSITIVE Sensitive     CIPROFLOXACIN >=4 RESISTANT Resistant     GENTAMICIN <=1 SENSITIVE Sensitive     IMIPENEM <=0.25 SENSITIVE Sensitive     NITROFURANTOIN <=16 SENSITIVE Sensitive     TRIMETH/SULFA <=20 SENSITIVE  Sensitive     AMPICILLIN/SULBACTAM 4 SENSITIVE Sensitive     PIP/TAZO <=4 SENSITIVE Sensitive     Extended ESBL NEGATIVE Sensitive     * 50,000 COLONIES/mL ESCHERICHIA COLI  Culture, blood (routine x 2)     Status: None (Preliminary result)   Collection Time: 07/18/18 10:50 AM  Result Value Ref Range Status   Specimen Description BLOOD BLOOD RIGHT ARM  Final   Special Requests   Final    BOTTLES DRAWN AEROBIC AND ANAEROBIC Blood Culture results may not be optimal due to an excessive volume of blood received in culture bottles   Culture   Final    NO GROWTH 3 DAYS Performed at [facility], [address], [place], [place] [address]    Report Status PENDING  Incomplete    Radiology Reports Ct Head Wo Contrast  Result Date: 07/18/2018 CLINICAL DATA:  Altered level of consciousness, unexplained. Patient is now unresponsive. Recent UTI. EXAM: CT HEAD WITHOUT CONTRAST TECHNIQUE: Contiguous axial images were obtained from the base of the skull through the vertex without intravenous contrast. COMPARISON:  MRI brain 06/26/2018. CT head without contrast 06/26/2018. FINDINGS: Brain: Advanced atrophy and diffuse white matter changes are again seen. Remote lacunar infarcts of the basal ganglia are stable. No acute infarct, hemorrhage, or mass lesion is present. The ventricles are proportionate to the degree of atrophy. Brainstem and cerebellum within limits. Vascular: Atherosclerotic calcifications are present in the cavernous internal carotid arteries bilaterally and at the dural margin of both vertebral arteries. There is no hyperdense vessel. Skull: Calvarium is intact. Hyperostosis is noted. No focal lytic or blastic lesions are present. No significant extracranial soft tissue lesions are present. Sinuses/Orbits: The paranasal sinuses and mastoid air cells are clear. Bilateral lens replacements are present. Globes and orbits are otherwise within limits. IMPRESSION: 1. No acute intracranial  abnormality. 2. Stable advanced atrophy and diffuse white matter disease. This likely reflects the sequela of chronic microvascular ischemia. 3. Stable remote lacunar infarcts of the basal ganglia bilaterally. 4. Atherosclerosis. Electronically Signed   By: Marin Roberts M.D.   On: 07/18/2018 11:00   Mr Brain Wo Contrast  Result Date: 06/26/2018 CLINICAL DATA:  84 year old female code stroke, altered mental status, slurred speech, left facial droop. EXAM: MRI HEAD WITHOUT CONTRAST TECHNIQUE: Multiplanar, multiecho pulse sequences of the brain and surrounding structures were obtained without intravenous contrast. COMPARISON:  Head CT without contrast 0131 hours today. Head face and cervical spine CT 12/01/2017. FINDINGS: Brain: No restricted diffusion or evidence of acute infarction. Extensive T2 heterogeneity in  the bilateral deep gray matter nuclei appears to reflect a combination of dilated perivascular spaces and chronic lacunes (right caudate and are thalamus). There is a small chronic lacune in the left paracentral pons. There is a tiny chronic infarct in the right cerebellum (series 7, image 9). Patchy and confluent bilateral cerebral white matter T2 and FLAIR hyperintensity. Occasional chronic micro hemorrhages in the brain, including the left cerebellum on series 9, image 19. No midline shift, mass effect, evidence of mass lesion, ventriculomegaly, extra-axial collection or acute intracranial hemorrhage. Cervicomedullary junction and pituitary are within normal limits. Vascular: Major intracranial vascular flow voids are preserved. Skull and upper cervical spine: Widespread cervical spine degeneration with mild reversal of lordosis. Hyperostosis of the calvarium. Visible bone marrow signal remains within normal limits. Sinuses/Orbits: Postoperative changes to both globes. Otherwise normal orbits soft tissues. Paranasal sinuses and mastoids are stable and well pneumatized. Other: Grossly normal  visible internal auditory structures. Scalp and face soft tissues appear negative. IMPRESSION: 1.  No acute intracranial abnormality. 2. Moderate to advanced signal changes in the brain compatible with chronic small vessel disease. Electronically Signed   By: Odessa Fleming M.D.   On: 06/26/2018 08:42   Dg Chest Portable 1 View  Result Date: 07/18/2018 CLINICAL DATA:  Altered mental status EXAM: PORTABLE CHEST 1 VIEW COMPARISON:  June 26, 2018 FINDINGS: There is no edema or consolidation. Heart is upper normal in size with pulmonary vascularity normal. There is aortic atherosclerosis. No evident adenopathy. No bone lesions. IMPRESSION: Aortic atherosclerosis. No edema or consolidation. Stable cardiac silhouette. Aortic Atherosclerosis (ICD10-I70.0). Electronically Signed   By: Bretta Bang III M.D.   On: 07/18/2018 10:43   Dg Chest Portable 1 View  Result Date: 06/26/2018 CLINICAL DATA:  Altered mental status EXAM: PORTABLE CHEST 1 VIEW COMPARISON:  None. FINDINGS: Cardiac shadows within normal limits. Aortic calcifications are seen. The lungs are well aerated bilaterally. No focal infiltrate or sizable effusion is noted. No bony abnormality is seen. IMPRESSION: No active disease. Electronically Signed   By: Alcide Clever M.D.   On: 06/26/2018 01:53   Ct Head Code Stroke Wo Contrast  Result Date: 06/26/2018 CLINICAL DATA:  Code stroke. Left-sided facial droop, right-sided gaze, right dilated pupil. EXAM: CT HEAD WITHOUT CONTRAST TECHNIQUE: Contiguous axial images were obtained from the base of the skull through the vertex without intravenous contrast. COMPARISON:  12/01/2017 CT head FINDINGS: Brain: No evidence of acute infarction, hemorrhage, hydrocephalus, extra-axial collection or mass lesion/mass effect. Advanced chronic microvascular ischemic changes, volume loss of the brain, small chronic infarct in left hemi pons, and multiple small chronic infarcts in the bilateral basal ganglia are stable in  comparison with prior CT of the head. Vascular: Calcific atherosclerosis of the carotid siphons and vertebral arteries. No hyperdense vessel identified. Skull: Normal. Negative for fracture or focal lesion. Sinuses/Orbits: No acute finding. Other: None. ASPECTS [facility] Stroke Program Early CT Score) - Ganglionic level infarction (caudate, lentiform nuclei, internal capsule, insula, M1-M3 cortex): 7 - Supraganglionic infarction (M4-M6 cortex): 3 Total score (0-10 with 10 being normal): 10 IMPRESSION: 1. No acute intracranial abnormality identified. 2. ASPECTS is 10 3. Stable advanced chronic microvascular ischemic changes, volume loss of the brain, and multiple small chronic infarcts in the basal ganglia as well as pons. These results were communicated to Dr. Laurence Slate at 1:40 amon 8/12/2019by text page via the [facility] messaging system. Electronically Signed   By: Mitzi Hansen M.D.   On: 06/26/2018 01:41     CBC Recent  Labs  Lab 07/18/18 1010 07/19/18 0542  WBC 9.5 6.8  HGB 11.0* 11.1*  HCT 36.4 37.0  PLT 118* 86*  MCV 110.0* 110.1*  MCH 33.2 33.0  MCHC 30.2 30.0  RDW 14.4 14.3  LYMPHSABS 2.2  --   MONOABS 0.5  --   EOSABS 0.4  --   BASOSABS 0.0  --     Chemistries  Recent Labs  Lab 07/18/18 1010 07/18/18 1138 07/19/18 0542  NA 141  --  139  K 5.4* 5.4* 4.9  CL 105  --  106  CO2 26  --  25  GLUCOSE 107*  --  98  BUN 32*  --  28*  CREATININE 1.72*  --  1.40*  CALCIUM 8.7*  --  8.9  AST 13*  --   --   ALT 9  --   --   ALKPHOS 60  --   --   BILITOT 0.6  --   --    ------------------------------------------------------------------------------------------------------------------ estimated creatinine clearance is 24.6 mL/min (A) (by C-G formula based on SCr of 1.4 mg/dL (H)). ------------------------------------------------------------------------------------------------------------------ Recent Labs    07/19/18 0542  HGBA1C 5.6    ------------------------------------------------------------------------------------------------------------------ No results for input(s): CHOL, HDL, LDLCALC, TRIG, CHOLHDL, LDLDIRECT in the last 72 hours. ------------------------------------------------------------------------------------------------------------------ No results for input(s): TSH, T4TOTAL, T3FREE, THYROIDAB in the last 72 hours.  Invalid input(s): FREET3 ------------------------------------------------------------------------------------------------------------------ No results for input(s): VITAMINB12, FOLATE, FERRITIN, TIBC, IRON, RETICCTPCT in the last 72 hours.  Coagulation profile No results for input(s): INR, PROTIME in the last 168 hours.  No results for input(s): DDIMER in the last 72 hours.  Cardiac Enzymes No results for input(s): CKMB, TROPONINI, MYOGLOBIN in the last 168 hours.  Invalid input(s): CK ------------------------------------------------------------------------------------------------------------------ Invalid input(s): POCBNP   CBG: Recent Labs  Lab 07/20/18 1212 07/20/18 1725 07/20/18 2151 07/21/18 0819 07/21/18 1242  GLUCAP 126* 148* 101* 122* 159*       Studies: No results found.    Lab Results  Component Value Date   HGBA1C 5.6 07/19/2018   HGBA1C 5.5 06/26/2018   Lab Results  Component Value Date   LDLCALC 106 (H) 06/26/2018   CREATININE 1.40 (H) 07/19/2018       Scheduled Meds: . allopurinol  100 mg Oral Daily  . aspirin  325 mg Oral Daily  . cephALEXin  250 mg Oral Q12H  . docusate sodium  100 mg Oral BID  . enoxaparin (LOVENOX) injection  30 mg Subcutaneous Q24H  . insulin aspart  0-15 Units Subcutaneous TID WC  . metoprolol tartrate  25 mg Oral Daily  . mupirocin cream   Topical Daily   Continuous Infusions: . dextrose 5 % and 0.9% NaCl 75 mL/hr at 07/21/18 1055  . levETIRAcetam 500 mg (07/21/18 1334)     LOS: 3 days    Time spent: >30 MINS     Richarda Overlie  [facility] Pager [phone]. If 7PM-7AM, please contact night-coverage at [email or web address], password [facility] 07/21/2018, 2:35 PM  LOS: 3 days

## 2018-07-21 NOTE — Progress Notes (Signed)
  Speech Language Pathology Treatment: Dysphagia  Patient Details Name: Judith Turner MRN: [id number] DOB: [birth year] Today's Date: 07/21/2018 Time: 1000-1020 SLP Time Calculation (min) (ACUTE ONLY): 20 min  Assessment / Plan / Recommendation Clinical Impression  Pt seen for follow-up to assess diet tolerance and readiness for advancement of solids. Pt in bed eating breakfast independently upon SLP arrival, noted with pocketing of eggs bilaterally and prolonged mastication. Consumed thin liquids via cup and straw sips; there were no overt signs of aspiration and vocal quality remained clear throughout session. Pt required verbal cues (moderate) to swallow solids and take liquid wash before taking additional bites. Nurse tech reported pt did well with her meal last night, but she noticed pt with some pocketing of solids this morning. Would continue dys 2 with thin liquids, with intermittent but frequent supervision. Can attempt medications whole in puree; check for pocketing and crush if needed. Will follow up for advancement with improvements in mentation.       HPI HPI: Pt is a 84 y.o. female presenting with AMS from worsening dementia and UTI. CT negative for acute changes; CXR negative for consolidation. BSE completed during recent admission in August 2019 recommended NPO due to mentation, but she was advanced the next day to Dys 3 diet and thin liquids as her mentation improved. PMH: CVA, CKD, HTN, HLD, DM, and COPD       SLP Plan  Continue with current plan of care       Recommendations  Diet recommendations: Dysphagia 2 (fine chop);Thin liquid Liquids provided via: Cup;Straw Medication Administration: Whole meds with puree(crush if pocketing) Supervision: Patient able to self feed;Intermittent supervision to cue for compensatory strategies Compensations: Slow rate;Small sips/bites;Follow solids with liquid Postural Changes and/or Swallow Maneuvers: Upright 30-60 min after meal;Seated  upright 90 degrees                Oral Care Recommendations: Oral care BID Follow up Recommendations: Skilled Nursing facility SLP Visit Diagnosis: Dysphagia, oral phase (R13.11) Plan: Continue with current plan of care       GO               Rondel Baton, MS, CCC-SLP Speech-Language Pathologist Acute Rehabilitation Services Pager: [phone] Office: [phone]  Judith Turner 07/21/2018, 11:03 AM

## 2018-07-21 NOTE — Progress Notes (Signed)
Clinical Social Worker following patient for support and discharge needs. CSW received phone call from [facility] stating that patient has been denied but stated they will do a per to per with patients MD. CSW relayed message to MD and stated that per to per would need to be done by 4 pm today. CSW awaiting per to per results   Contact number for insurance: [phone], option 5   Marrianne Mood, MSW,  [place] [phone]

## 2018-07-21 NOTE — Progress Notes (Signed)
Clinical Social Worker received phone call back from [facility] stating patient was denied after per to per was done. Per insurance patient can return back to long term facility with intermittent rehab. CSW will continue to follow patient until medically discharge.   Marrianne Mood, MSW,  [employer] [phone]

## 2018-07-22 LAB — BASIC METABOLIC PANEL
Anion gap: 10 (ref 5–15)
BUN: 15 mg/dL (ref 8–23)
CALCIUM: 8.7 mg/dL — AB (ref 8.9–10.3)
CO2: 26 mmol/L (ref 22–32)
CREATININE: 1.19 mg/dL — AB (ref 0.44–1.00)
Chloride: 108 mmol/L (ref 98–111)
GFR calc Af Amer: 45 mL/min — ABNORMAL LOW (ref >60)
GFR calc non Af Amer: 38 mL/min — ABNORMAL LOW (ref >60)
Glucose, Bld: 113 mg/dL — ABNORMAL HIGH (ref 70–99)
Potassium: 3.7 mmol/L (ref 3.5–5.1)
SODIUM: 144 mmol/L (ref 135–145)

## 2018-07-22 LAB — GLUCOSE, CAPILLARY
Glucose-Capillary: 115 mg/dL — ABNORMAL HIGH (ref 70–99)
Glucose-Capillary: 122 mg/dL — ABNORMAL HIGH (ref 70–99)

## 2018-07-22 MED ORDER — HYDROCODONE-ACETAMINOPHEN 5-325 MG PO TABS: 1.0000 | ORAL_TABLET | ORAL | 0 refills | Status: DC | PRN

## 2018-07-22 MED ORDER — AMLODIPINE BESYLATE 10 MG PO TABS: 5.0000 mg | ORAL_TABLET | Freq: Every day | ORAL | 0 refills | Status: AC

## 2018-07-22 MED ORDER — MUPIROCIN CALCIUM 2 % EX CREA: TOPICAL_CREAM | Freq: Two times a day (BID) | CUTANEOUS | 0 refills | Status: DC

## 2018-07-22 MED ORDER — LEVETIRACETAM 100 MG/ML PO SOLN
500.0000 mg | Freq: Two times a day (BID) | ORAL | Status: DC
  Filled 2018-07-22: qty 5

## 2018-07-22 MED ORDER — CEPHALEXIN 250 MG PO CAPS: 250.0000 mg | ORAL_CAPSULE | Freq: Two times a day (BID) | ORAL | 0 refills | Status: AC

## 2018-07-22 NOTE — Progress Notes (Signed)
I called the daughter, Crystalrose Washer and spoke with her about the code status.  She states that pt is a full code and I changed this order in the computer and informed [facility] personnel.

## 2018-07-22 NOTE — Progress Notes (Signed)
Pt to be transported back to [facility] today via [facility], spoke with CSW and Dr. Leonides Schanz the gold DNR sheet.

## 2018-07-22 NOTE — Clinical Social Work Note (Signed)
Clinical Social Worker facilitated patient discharge including contacting patient family and facility to confirm patient discharge plans.  Clinical information faxed to facility and family agreeable with plan.  CSW arranged ambulance transport via [facility] to [facility].  RN to call [phone] (ask for Jacquenette Shone)  for report prior to discharge.  Clinical Social Worker will sign off for now as social work intervention is no longer needed. Please consult [place] again if new need arises.  [place], [place] [phone]

## 2018-07-22 NOTE — Discharge Summary (Addendum)
Physician Discharge Summary  Judith Turner ZOX:[id number] DOB: [birth year] DOA: 07/18/2018  PCP: System, Pcp Not In  Admit date: 07/18/2018 Discharge date: 07/22/2018  Admitted From: SNF Disposition:  SNF  Discharge Condition:Stable CODE STATUS: DNR Diet recommendation: Dysphagia 2 (Fine chop);Thin liquid   Brief/Interim Summary:  Patient is a 84 y.o.femalewith history of COPD, diabetes, gout, hyperlipidemia, hypertension, renal disease, stroke who presented from [facility] with altered mental status.Patient was admitted for worsening dementia, UTI.  Mental status improved with antibiotics and now she is currently on baseline.  She is being discharged back to skilled nursing facility where she came from.  Following problems were addressed during hospitalization:  Worsening dementia Presents with confusion with environmental changes, lethargy, altered mental status in the setting of acute kidney injury and UTI Mental status has improved and back to baseline.  UTI with a positive urine culture Treated with Rocephin , mental status is improved Changed  to  Keflex for wound of the leg and UTI  Left posterior leg wound Left posterior leg with full thickness wound; 20% dark red-brown, 80% red, 9X8X.2cm small amt pink drainage, no odor Wound care recommends Bactroban to promote moist healing, foam dressing to protect from further injury..   Hypertension Continue metoprolol, added Norvasc due to uncontrolled blood pressure .  Gout  Continue allopurinol,   history of seizure Continue Keppra   DM Hemoglobin A1c 5.6.  She was on metformin at skilled nursing facility which will discontinue.  Discharge Diagnoses:  Principal Problem:   Altered mental status Active Problems:   Diabetes mellitus without complication (HCC)   Skin ulcer-left leg   Essential hypertension   Acute renal failure (ARF) (HCC)    Discharge Instructions  Discharge Instructions    Diet - low  sodium heart healthy   Complete by:  As directed    Discharge instructions   Complete by:  As directed    1)Take prescribed medications as instructed. 2) Do a CBC and BMP test in a week.   Increase activity slowly   Complete by:  As directed      Allergies as of 07/22/2018   No Known Allergies     Medication List    STOP taking these medications   metFORMIN 500 MG tablet Commonly known as:  GLUCOPHAGE     TAKE these medications   acetaminophen 325 MG tablet Commonly known as:  TYLENOL Take 650 mg by mouth every 4 (four) hours as needed.   albuterol 108 (90 Base) MCG/ACT inhaler Commonly known as:  PROVENTIL HFA;VENTOLIN HFA Inhale into the lungs 4 (four) times daily as needed for wheezing or shortness of breath.   allopurinol 100 MG tablet Commonly known as:  ZYLOPRIM Take 100 mg by mouth daily.   amLODipine 10 MG tablet Commonly known as:  NORVASC Take 0.5 tablets (5 mg total) by mouth daily. Start taking on:  07/23/2018   aspirin 325 MG tablet Take 325 mg by mouth daily.   cephALEXin 250 MG capsule Commonly known as:  KEFLEX Take 1 capsule (250 mg total) by mouth every 12 (twelve) hours for 3 days.   cholecalciferol 1000 units tablet Commonly known as:  VITAMIN D Take 1,000 Units by mouth daily.   HYDROcodone-acetaminophen 5-325 MG tablet Commonly known as:  NORCO/VICODIN Take 1 tablet by mouth every 4 (four) hours as needed for severe pain.   ibuprofen 200 MG tablet Commonly known as:  ADVIL,MOTRIN Take 200 mg by mouth every 6 (six) hours as needed for mild  pain.   levETIRAcetam 500 MG tablet Commonly known as:  KEPPRA Take 1 tablet (500 mg total) by mouth 2 (two) times daily.   magnesium oxide 400 MG tablet Commonly known as:  MAG-OX Take 400 mg by mouth 2 (two) times daily.   metoprolol tartrate 25 MG tablet Commonly known as:  LOPRESSOR Take 25 mg by mouth daily.   mupirocin cream 2 % Commonly known as:  BACTROBAN Apply topically 2 (two) times  daily. On right lower extremity wound   RISA-BID PROBIOTIC Tabs Take 1 tablet by mouth daily.   senna 8.6 MG Tabs tablet Commonly known as:  SENOKOT Take 1 tablet by mouth daily as needed for mild constipation.      Contact information for after-discharge care    Destination    [facility] Preferred SNF .   Service:  Skilled Nursing Contact information: [address] [place] [address] [phone]             No Known Allergies  Consultations: None  Procedures/Studies: Ct Head Wo Contrast  Result Date: 07/18/2018 CLINICAL DATA:  Altered level of consciousness, unexplained. Patient is now unresponsive. Recent UTI. EXAM: CT HEAD WITHOUT CONTRAST TECHNIQUE: Contiguous axial images were obtained from the base of the skull through the vertex without intravenous contrast. COMPARISON:  MRI brain 06/26/2018. CT head without contrast 06/26/2018. FINDINGS: Brain: Advanced atrophy and diffuse white matter changes are again seen. Remote lacunar infarcts of the basal ganglia are stable. No acute infarct, hemorrhage, or mass lesion is present. The ventricles are proportionate to the degree of atrophy. Brainstem and cerebellum within limits. Vascular: Atherosclerotic calcifications are present in the cavernous internal carotid arteries bilaterally and at the dural margin of both vertebral arteries. There is no hyperdense vessel. Skull: Calvarium is intact. Hyperostosis is noted. No focal lytic or blastic lesions are present. No significant extracranial soft tissue lesions are present. Sinuses/Orbits: The paranasal sinuses and mastoid air cells are clear. Bilateral lens replacements are present. Globes and orbits are otherwise within limits. IMPRESSION: 1. No acute intracranial abnormality. 2. Stable advanced atrophy and diffuse white matter disease. This likely reflects the sequela of chronic microvascular ischemia. 3. Stable remote lacunar infarcts of the basal  ganglia bilaterally. 4. Atherosclerosis. Electronically Signed   By: Marin Roberts M.D.   On: 07/18/2018 11:00   Mr Brain Wo Contrast  Result Date: 06/26/2018 CLINICAL DATA:  84 year old female code stroke, altered mental status, slurred speech, left facial droop. EXAM: MRI HEAD WITHOUT CONTRAST TECHNIQUE: Multiplanar, multiecho pulse sequences of the brain and surrounding structures were obtained without intravenous contrast. COMPARISON:  Head CT without contrast 0131 hours today. Head face and cervical spine CT 12/01/2017. FINDINGS: Brain: No restricted diffusion or evidence of acute infarction. Extensive T2 heterogeneity in the bilateral deep gray matter nuclei appears to reflect a combination of dilated perivascular spaces and chronic lacunes (right caudate and are thalamus). There is a small chronic lacune in the left paracentral pons. There is a tiny chronic infarct in the right cerebellum (series 7, image 9). Patchy and confluent bilateral cerebral white matter T2 and FLAIR hyperintensity. Occasional chronic micro hemorrhages in the brain, including the left cerebellum on series 9, image 19. No midline shift, mass effect, evidence of mass lesion, ventriculomegaly, extra-axial collection or acute intracranial hemorrhage. Cervicomedullary junction and pituitary are within normal limits. Vascular: Major intracranial vascular flow voids are preserved. Skull and upper cervical spine: Widespread cervical spine degeneration with mild reversal of lordosis. Hyperostosis of  the calvarium. Visible bone marrow signal remains within normal limits. Sinuses/Orbits: Postoperative changes to both globes. Otherwise normal orbits soft tissues. Paranasal sinuses and mastoids are stable and well pneumatized. Other: Grossly normal visible internal auditory structures. Scalp and face soft tissues appear negative. IMPRESSION: 1.  No acute intracranial abnormality. 2. Moderate to advanced signal changes in the brain  compatible with chronic small vessel disease. Electronically Signed   By: Odessa Fleming M.D.   On: 06/26/2018 08:42   Dg Chest Portable 1 View  Result Date: 07/18/2018 CLINICAL DATA:  Altered mental status EXAM: PORTABLE CHEST 1 VIEW COMPARISON:  June 26, 2018 FINDINGS: There is no edema or consolidation. Heart is upper normal in size with pulmonary vascularity normal. There is aortic atherosclerosis. No evident adenopathy. No bone lesions. IMPRESSION: Aortic atherosclerosis. No edema or consolidation. Stable cardiac silhouette. Aortic Atherosclerosis (ICD10-I70.0). Electronically Signed   By: Bretta Bang III M.D.   On: 07/18/2018 10:43   Dg Chest Portable 1 View  Result Date: 06/26/2018 CLINICAL DATA:  Altered mental status EXAM: PORTABLE CHEST 1 VIEW COMPARISON:  None. FINDINGS: Cardiac shadows within normal limits. Aortic calcifications are seen. The lungs are well aerated bilaterally. No focal infiltrate or sizable effusion is noted. No bony abnormality is seen. IMPRESSION: No active disease. Electronically Signed   By: Alcide Clever M.D.   On: 06/26/2018 01:53   Ct Head Code Stroke Wo Contrast  Result Date: 06/26/2018 CLINICAL DATA:  Code stroke. Left-sided facial droop, right-sided gaze, right dilated pupil. EXAM: CT HEAD WITHOUT CONTRAST TECHNIQUE: Contiguous axial images were obtained from the base of the skull through the vertex without intravenous contrast. COMPARISON:  12/01/2017 CT head FINDINGS: Brain: No evidence of acute infarction, hemorrhage, hydrocephalus, extra-axial collection or mass lesion/mass effect. Advanced chronic microvascular ischemic changes, volume loss of the brain, small chronic infarct in left hemi pons, and multiple small chronic infarcts in the bilateral basal ganglia are stable in comparison with prior CT of the head. Vascular: Calcific atherosclerosis of the carotid siphons and vertebral arteries. No hyperdense vessel identified. Skull: Normal. Negative for fracture  or focal lesion. Sinuses/Orbits: No acute finding. Other: None. ASPECTS [facility] Stroke Program Early CT Score) - Ganglionic level infarction (caudate, lentiform nuclei, internal capsule, insula, M1-M3 cortex): 7 - Supraganglionic infarction (M4-M6 cortex): 3 Total score (0-10 with 10 being normal): 10 IMPRESSION: 1. No acute intracranial abnormality identified. 2. ASPECTS is 10 3. Stable advanced chronic microvascular ischemic changes, volume loss of the brain, and multiple small chronic infarcts in the basal ganglia as well as pons. These results were communicated to Dr. Laurence Slate at 1:40 amon 8/12/2019by text page via the [facility] messaging system. Electronically Signed   By: Mitzi Hansen M.D.   On: 06/26/2018 01:41      Subjective: Patient seen and examined the bedside this afternoon.  Remains comfortable.  No new issues.  Discharge Exam: Vitals:   07/21/18 2154 07/22/18 0540  BP: (!) 185/65 (!) 169/71  Pulse: 65 69  Resp: 17 18  Temp: 98.1 F (36.7 C) 97.8 F (36.6 C)  SpO2: 99% 98%   Vitals:   07/21/18 1500 07/21/18 1700 07/21/18 2154 07/22/18 0540  BP: (!) 193/73 (!) 153/70 (!) 185/65 (!) 169/71  Pulse: 68  65 69  Resp:   17 18  Temp: 97.7 F (36.5 C)  98.1 F (36.7 C) 97.8 F (36.6 C)  TempSrc: Oral  Oral Oral  SpO2: 95%  99% 98%  Weight:      Height:  General: Pt is alert, awake, not in acute distress Cardiovascular: RRR, S1/S2 +, no rubs, no gallops Respiratory: CTA bilaterally, no wheezing, no rhonchi Abdominal: Soft, NT, ND, bowel sounds + Extremities: no edema, no cyanosis,wound on the ventral aspect of the right leg    The results of significant diagnostics from this hospitalization (including imaging, microbiology, ancillary and laboratory) are listed below for reference.     Microbiology: Recent Results (from the past 240 hour(s))  Culture, blood (routine x 2)     Status: Abnormal   Collection Time: 07/18/18 10:10 AM  Result Value Ref Range  Status   Specimen Description BLOOD RIGHT ANTECUBITAL  Final   Special Requests   Final    BOTTLES DRAWN AEROBIC AND ANAEROBIC Blood Culture adequate volume   Culture  Setup Time   Final    GRAM POSITIVE COCCI AEROBIC BOTTLE ONLY Organism ID to follow CRITICAL RESULT CALLED TO, READ BACK BY AND VERIFIED WITH: G ABBOTT PHARMD 0518 07/19/18 A BROWNING    Culture (A)  Final    STAPHYLOCOCCUS SPECIES (COAGULASE NEGATIVE) THE SIGNIFICANCE OF ISOLATING THIS ORGANISM FROM A SINGLE SET OF BLOOD CULTURES WHEN MULTIPLE SETS ARE DRAWN IS UNCERTAIN. PLEASE NOTIFY THE MICROBIOLOGY DEPARTMENT WITHIN ONE WEEK IF SPECIATION AND SENSITIVITIES ARE REQUIRED. Performed at [facility], [address] [place], [place] [address]    Report Status 07/21/2018 FINAL  Final  Blood Culture ID Panel (Reflexed)     Status: Abnormal   Collection Time: 07/18/18 10:10 AM  Result Value Ref Range Status   Enterococcus species NOT DETECTED NOT DETECTED Final   Listeria monocytogenes NOT DETECTED NOT DETECTED Final   Staphylococcus species DETECTED (A) NOT DETECTED Final    Comment: Methicillin (oxacillin) resistant coagulase negative staphylococcus. Possible blood culture contaminant (unless isolated from more than one blood culture draw or clinical case suggests pathogenicity). No antibiotic treatment is indicated for blood  culture contaminants. CRITICAL RESULT CALLED TO, READ BACK BY AND VERIFIED WITH: G ABBOTT PHARMD 0518 07/19/18 A BROWNING    Staphylococcus aureus NOT DETECTED NOT DETECTED Final   Methicillin resistance DETECTED (A) NOT DETECTED Final    Comment: CRITICAL RESULT CALLED TO, READ BACK BY AND VERIFIED WITH: G ABBOTT PHARMD 0518 07/19/18 A BROWNING    Streptococcus species NOT DETECTED NOT DETECTED Final   Streptococcus agalactiae NOT DETECTED NOT DETECTED Final   Streptococcus pneumoniae NOT DETECTED NOT DETECTED Final   Streptococcus pyogenes NOT DETECTED NOT DETECTED Final   Acinetobacter  baumannii NOT DETECTED NOT DETECTED Final   Enterobacteriaceae species NOT DETECTED NOT DETECTED Final   Enterobacter cloacae complex NOT DETECTED NOT DETECTED Final   Escherichia coli NOT DETECTED NOT DETECTED Final   Klebsiella oxytoca NOT DETECTED NOT DETECTED Final   Klebsiella pneumoniae NOT DETECTED NOT DETECTED Final   Proteus species NOT DETECTED NOT DETECTED Final   Serratia marcescens NOT DETECTED NOT DETECTED Final   Haemophilus influenzae NOT DETECTED NOT DETECTED Final   Neisseria meningitidis NOT DETECTED NOT DETECTED Final   Pseudomonas aeruginosa NOT DETECTED NOT DETECTED Final   Candida albicans NOT DETECTED NOT DETECTED Final   Candida glabrata NOT DETECTED NOT DETECTED Final   Candida krusei NOT DETECTED NOT DETECTED Final   Candida parapsilosis NOT DETECTED NOT DETECTED Final   Candida tropicalis NOT DETECTED NOT DETECTED Final    Comment: Performed at [facility], [address], [place], [place] [address]  Urine culture     Status: Abnormal   Collection  Time: 07/18/18 10:21 AM  Result Value Ref Range Status   Specimen Description URINE, RANDOM  Final   Special Requests   Final    NONE Performed at [facility], [address], [place], [place] [address]    Culture 50,000 COLONIES/mL ESCHERICHIA COLI (A)  Final   Report Status 07/20/2018 FINAL  Final   Organism ID, Bacteria ESCHERICHIA COLI (A)  Final      Susceptibility   Escherichia coli - MIC*    AMPICILLIN 8 SENSITIVE Sensitive     CEFAZOLIN <=4 SENSITIVE Sensitive     CEFTRIAXONE <=1 SENSITIVE Sensitive     CIPROFLOXACIN >=4 RESISTANT Resistant     GENTAMICIN <=1 SENSITIVE Sensitive     IMIPENEM <=0.25 SENSITIVE Sensitive     NITROFURANTOIN <=16 SENSITIVE Sensitive     TRIMETH/SULFA <=20 SENSITIVE Sensitive     AMPICILLIN/SULBACTAM 4 SENSITIVE Sensitive     PIP/TAZO <=4 SENSITIVE Sensitive     Extended ESBL NEGATIVE Sensitive     * 50,000 COLONIES/mL ESCHERICHIA COLI  Culture, blood  (routine x 2)     Status: None (Preliminary result)   Collection Time: 07/18/18 10:50 AM  Result Value Ref Range Status   Specimen Description BLOOD BLOOD RIGHT ARM  Final   Special Requests   Final    BOTTLES DRAWN AEROBIC AND ANAEROBIC Blood Culture results may not be optimal due to an excessive volume of blood received in culture bottles   Culture   Final    NO GROWTH 4 DAYS Performed at [facility], [address], [place], [place] [address]    Report Status PENDING  Incomplete     Labs: BNP (last 3 results) No results for input(s): BNP in the last 8760 hours. Basic Metabolic Panel: Recent Labs  Lab 07/18/18 1010 07/18/18 1138 07/19/18 0542 07/22/18 0625  NA 141  --  139 144  K 5.4* 5.4* 4.9 3.7  CL 105  --  106 108  CO2 26  --  25 26  GLUCOSE 107*  --  98 113*  BUN 32*  --  28* 15  CREATININE 1.72*  --  1.40* 1.19*  CALCIUM 8.7*  --  8.9 8.7*   Liver Function Tests: Recent Labs  Lab 07/18/18 1010  AST 13*  ALT 9  ALKPHOS 60  BILITOT 0.6  PROT 5.5*  ALBUMIN 2.8*   No results for input(s): LIPASE, AMYLASE in the last 168 hours. Recent Labs  Lab 07/18/18 1941  AMMONIA 20   CBC: Recent Labs  Lab 07/18/18 1010 07/19/18 0542  WBC 9.5 6.8  NEUTROABS 6.3  --   HGB 11.0* 11.1*  HCT 36.4 37.0  MCV 110.0* 110.1*  PLT 118* 86*   Cardiac Enzymes: No results for input(s): CKTOTAL, CKMB, CKMBINDEX, TROPONINI in the last 168 hours. BNP: Invalid input(s): POCBNP CBG: Recent Labs  Lab 07/21/18 1242 07/21/18 1702 07/21/18 2151 07/22/18 0803 07/22/18 1231  GLUCAP 159* 109* 134* 115* 122*   D-Dimer No results for input(s): DDIMER in the last 72 hours. Hgb A1c No results for input(s): HGBA1C in the last 72 hours. Lipid Profile No results for input(s): CHOL, HDL, LDLCALC, TRIG, CHOLHDL, LDLDIRECT in the last 72 hours. Thyroid function studies No results for input(s): TSH, T4TOTAL, T3FREE, THYROIDAB in the last 72 hours.  Invalid input(s):  FREET3 Anemia work up No results for input(s): VITAMINB12, FOLATE, FERRITIN, TIBC, IRON, RETICCTPCT in the last 72 hours. Urinalysis    Component Value Date/Time  COLORURINE YELLOW 07/18/2018 1000   APPEARANCEUR CLEAR 07/18/2018 1000   LABSPEC 1.019 07/18/2018 1000   PHURINE 5.0 07/18/2018 1000   GLUCOSEU NEGATIVE 07/18/2018 1000   HGBUR SMALL (A) 07/18/2018 1000   BILIRUBINUR NEGATIVE 07/18/2018 1000   KETONESUR NEGATIVE 07/18/2018 1000   PROTEINUR NEGATIVE 07/18/2018 1000   NITRITE NEGATIVE 07/18/2018 1000   LEUKOCYTESUR TRACE (A) 07/18/2018 1000   Sepsis Labs Invalid input(s): PROCALCITONIN,  WBC,  LACTICIDVEN Microbiology Recent Results (from the past 240 hour(s))  Culture, blood (routine x 2)     Status: Abnormal   Collection Time: 07/18/18 10:10 AM  Result Value Ref Range Status   Specimen Description BLOOD RIGHT ANTECUBITAL  Final   Special Requests   Final    BOTTLES DRAWN AEROBIC AND ANAEROBIC Blood Culture adequate volume   Culture  Setup Time   Final    GRAM POSITIVE COCCI AEROBIC BOTTLE ONLY Organism ID to follow CRITICAL RESULT CALLED TO, READ BACK BY AND VERIFIED WITH: G ABBOTT PHARMD 0518 07/19/18 A BROWNING    Culture (A)  Final    STAPHYLOCOCCUS SPECIES (COAGULASE NEGATIVE) THE SIGNIFICANCE OF ISOLATING THIS ORGANISM FROM A SINGLE SET OF BLOOD CULTURES WHEN MULTIPLE SETS ARE DRAWN IS UNCERTAIN. PLEASE NOTIFY THE MICROBIOLOGY DEPARTMENT WITHIN ONE WEEK IF SPECIATION AND SENSITIVITIES ARE REQUIRED. Performed at [facility], [address], [place], [place] [address]    Report Status 07/21/2018 FINAL  Final  Blood Culture ID Panel (Reflexed)     Status: Abnormal   Collection Time: 07/18/18 10:10 AM  Result Value Ref Range Status   Enterococcus species NOT DETECTED NOT DETECTED Final   Listeria monocytogenes NOT DETECTED NOT DETECTED Final   Staphylococcus species DETECTED (A) NOT DETECTED Final    Comment: Methicillin (oxacillin) resistant coagulase  negative staphylococcus. Possible blood culture contaminant (unless isolated from more than one blood culture draw or clinical case suggests pathogenicity). No antibiotic treatment is indicated for blood  culture contaminants. CRITICAL RESULT CALLED TO, READ BACK BY AND VERIFIED WITH: G ABBOTT PHARMD 0518 07/19/18 A BROWNING    Staphylococcus aureus NOT DETECTED NOT DETECTED Final   Methicillin resistance DETECTED (A) NOT DETECTED Final    Comment: CRITICAL RESULT CALLED TO, READ BACK BY AND VERIFIED WITH: G ABBOTT PHARMD 0518 07/19/18 A BROWNING    Streptococcus species NOT DETECTED NOT DETECTED Final   Streptococcus agalactiae NOT DETECTED NOT DETECTED Final   Streptococcus pneumoniae NOT DETECTED NOT DETECTED Final   Streptococcus pyogenes NOT DETECTED NOT DETECTED Final   Acinetobacter baumannii NOT DETECTED NOT DETECTED Final   Enterobacteriaceae species NOT DETECTED NOT DETECTED Final   Enterobacter cloacae complex NOT DETECTED NOT DETECTED Final   Escherichia coli NOT DETECTED NOT DETECTED Final   Klebsiella oxytoca NOT DETECTED NOT DETECTED Final   Klebsiella pneumoniae NOT DETECTED NOT DETECTED Final   Proteus species NOT DETECTED NOT DETECTED Final   Serratia marcescens NOT DETECTED NOT DETECTED Final   Haemophilus influenzae NOT DETECTED NOT DETECTED Final   Neisseria meningitidis NOT DETECTED NOT DETECTED Final   Pseudomonas aeruginosa NOT DETECTED NOT DETECTED Final   Candida albicans NOT DETECTED NOT DETECTED Final   Candida glabrata NOT DETECTED NOT DETECTED Final   Candida krusei NOT DETECTED NOT DETECTED Final   Candida parapsilosis NOT DETECTED NOT DETECTED Final   Candida tropicalis NOT DETECTED NOT DETECTED Final    Comment: Performed at [facility], [address], [place], [place] [address]  Urine culture  Status: Abnormal   Collection Time: 07/18/18 10:21 AM  Result Value Ref Range Status   Specimen Description URINE, RANDOM  Final   Special Requests    Final    NONE Performed at [facility], [address] [place], [place] [address]    Culture 50,000 COLONIES/mL ESCHERICHIA COLI (A)  Final   Report Status 07/20/2018 FINAL  Final   Organism ID, Bacteria ESCHERICHIA COLI (A)  Final      Susceptibility   Escherichia coli - MIC*    AMPICILLIN 8 SENSITIVE Sensitive     CEFAZOLIN <=4 SENSITIVE Sensitive     CEFTRIAXONE <=1 SENSITIVE Sensitive     CIPROFLOXACIN >=4 RESISTANT Resistant     GENTAMICIN <=1 SENSITIVE Sensitive     IMIPENEM <=0.25 SENSITIVE Sensitive     NITROFURANTOIN <=16 SENSITIVE Sensitive     TRIMETH/SULFA <=20 SENSITIVE Sensitive     AMPICILLIN/SULBACTAM 4 SENSITIVE Sensitive     PIP/TAZO <=4 SENSITIVE Sensitive     Extended ESBL NEGATIVE Sensitive     * 50,000 COLONIES/mL ESCHERICHIA COLI  Culture, blood (routine x 2)     Status: None (Preliminary result)   Collection Time: 07/18/18 10:50 AM  Result Value Ref Range Status   Specimen Description BLOOD BLOOD RIGHT ARM  Final   Special Requests   Final    BOTTLES DRAWN AEROBIC AND ANAEROBIC Blood Culture results may not be optimal due to an excessive volume of blood received in culture bottles   Culture   Final    NO GROWTH 4 DAYS Performed at [facility], [address] [place], [place] [address]    Report Status PENDING  Incomplete    Please note: You were cared for by a hospitalist during your hospital stay. Once you are discharged, your primary care physician will handle any further medical issues. Please note that NO REFILLS for any discharge medications will be authorized once you are discharged, as it is imperative that you return to your primary care physician (or establish a relationship with a primary care physician if you do not have one) for your post hospital discharge needs so that they can reassess your need for medications and monitor your lab values.    Time coordinating discharge: 40 minutes  SIGNED:   Burnadette Pop, MD  [facility] 07/22/2018, 1:52 PM Pager [phone]  If 7PM-7AM, please contact night-coverage [email or web address] Password [email or web address]

## 2018-07-22 NOTE — Clinical Social Work Placement (Signed)
   CLINICAL SOCIAL WORK PLACEMENT  NOTE  Date:  07/22/2018  Patient Details  Name: Judith Turner MRN: [id number] Date of Birth: 09/18/1923  Clinical Social Work is seeking post-discharge placement for this patient at the Skilled  Nursing Facility level of care (*CSW will initial, date and re-position this form in  chart as items are completed):      Patient/family provided with [facility] Clinical Social Work Department's list of facilities offering this level of care within the geographic area requested by the patient (or if unable, by the patient's family).  Yes   Patient/family informed of their freedom to choose among providers that offer the needed level of care, that participate in Medicare, Medicaid or managed care program needed by the patient, have an available bed and are willing to accept the patient.      Patient/family informed of [facility]'s ownership interest in [facility] and [facility], as well as of the fact that they are under no obligation to receive care at these facilities.  PASRR submitted to EDS on       PASRR number received on       Existing PASRR number confirmed on       FL2 transmitted to all facilities in geographic area requested by pt/family on       FL2 transmitted to all facilities within larger geographic area on       Patient informed that his/her managed care company has contracts with or will negotiate with certain facilities, including the following:        Yes   Patient/family informed of bed offers received.  Patient chooses bed at [facility]     Physician recommends and patient chooses bed at      Patient to be transferred to [facility] on 07/22/18.  Patient to be transferred to facility by [facility]     Patient family notified on 07/22/18 of transfer.  Name of family member notified:  Steward Drone     PHYSICIAN       Additional Comment:    _______________________________________________ Maree Krabbe,  LCSW 07/22/2018, 1:39 PM

## 2018-07-22 NOTE — Progress Notes (Signed)
I was giving report to the nurse accepting pt at [facility] and she stated that the pt had always been a full code there.  (Order for DNR here). She called the daughter and the daughter stated she was a full code.  CSW [employer] and Dr. Renford Dills informed of this.

## 2018-07-23 LAB — CULTURE, BLOOD (ROUTINE X 2): CULTURE: NO GROWTH

## 2018-08-28 ENCOUNTER — Ambulatory Visit (INDEPENDENT_AMBULATORY_CARE_PROVIDER_SITE_OTHER): Payer: Medicare Other | Admitting: Neurology

## 2018-08-28 ENCOUNTER — Encounter: Payer: Self-pay | Admitting: Neurology

## 2018-08-28 VITALS — BP 129/69 | HR 63 | Wt 154.2 lb

## 2018-08-28 DIAGNOSIS — I679 Cerebrovascular disease, unspecified: Secondary | ICD-10-CM | POA: Diagnosis not present

## 2018-08-28 DIAGNOSIS — Z87898 Personal history of other specified conditions: Secondary | ICD-10-CM | POA: Diagnosis not present

## 2018-08-28 DIAGNOSIS — G3184 Mild cognitive impairment, so stated: Secondary | ICD-10-CM | POA: Diagnosis not present

## 2018-08-28 NOTE — Progress Notes (Signed)
PATIENT: Judith Turner DOB: Jun 14, 1923  Chief Complaint  Patient presents with  . New Patient (Initial Visit)    PCP: MD at [facility]. Patient reports that she is doing well since leaving the hospital.      HISTORICAL  Judith Turner is a 84 years old female, seen in request by her primary care physician Dr. Ellsworth Lennox, Kathie Rhodes Ahmed for follow-up of her most recent hospital discharge, initial evaluation was on August 28, 2018.  I have reviewed and summarized the hospital discharge on July 22, 2018,  She had a history of COPD, diabetes, gout, hyperlipidemia, hypertension, renal disease, stroke, was admitted to hospital from September 3 to 7 2019 for worsening dementia, confusion, UTI, left posterior leg wound, history of seizure, was kept on Keppra she has not had recurrent seizure for many years  She now lives at [facility], overall doing well, back to her baseline, she does has mild baseline memory loss,  I personally reviewed CT of head and MRI of the brain in September 2019: Generalized atrophy, supratentorium small vessel disease, stable lacunar infarction at bilateral basal ganglia, there was no acute abnormality.  Laboratory evaluation in September 2019 showed A1c of 5.6, CBC showed hemoglobin of 11.1, BMP showed creatinine of 1.4, ammonia of 20, negative troponin, TSH was normal 2.14, B12 1342.  REVIEW OF SYSTEMS: Full 14 system review of systems performed and notable only for AS ABOVE All other review of systems were negative.  ALLERGIES: No Known Allergies  HOME MEDICATIONS: Current Outpatient Medications  Medication Sig Dispense Refill  . acetaminophen (TYLENOL) 325 MG tablet Take 650 mg by mouth every 4 (four) hours as needed.    Marland Kitchen albuterol (PROVENTIL HFA;VENTOLIN HFA) 108 (90 Base) MCG/ACT inhaler Inhale into the lungs 4 (four) times daily as needed for wheezing or shortness of breath.    . allopurinol (ZYLOPRIM) 100 MG tablet Take 100 mg by mouth daily.     Marland Kitchen amLODipine (NORVASC) 10 MG tablet Take 0.5 tablets (5 mg total) by mouth daily. 30 tablet 0  . aspirin 325 MG tablet Take 325 mg by mouth daily.    . cholecalciferol (VITAMIN D) 1000 units tablet Take 1,000 Units by mouth daily.    Marland Kitchen ibuprofen (ADVIL,MOTRIN) 200 MG tablet Take 200 mg by mouth every 6 (six) hours as needed for mild pain.    Marland Kitchen levETIRAcetam (KEPPRA) 500 MG tablet Take 1 tablet (500 mg total) by mouth 2 (two) times daily. 60 tablet 0  . magnesium oxide (MAG-OX) 400 MG tablet Take 400 mg by mouth 2 (two) times daily.    . metoprolol tartrate (LOPRESSOR) 25 MG tablet Take 25 mg by mouth daily.     . Probiotic Product (RISA-BID PROBIOTIC) TABS Take 1 tablet by mouth daily.    Marland Kitchen senna (SENOKOT) 8.6 MG TABS tablet Take 1 tablet by mouth daily as needed for mild constipation.     No current facility-administered medications for this visit.     PAST MEDICAL HISTORY: Past Medical History:  Diagnosis Date  . COPD (chronic obstructive pulmonary disease) (HCC)   . Diabetes mellitus without complication (HCC)   . Gout   . Hyperlipidemia   . Hypertension   . Renal disorder    Stage 1 renal failure  . Stroke [facility])    TIA no residual  deficits    PAST SURGICAL HISTORY: History reviewed. No pertinent surgical history.  FAMILY HISTORY: Family History  Problem Relation Age of Onset  . Hypertension Mother   .  Hypertension Father     SOCIAL HISTORY: Social History   Socioeconomic History  . Marital status: Widowed    Spouse name: Not on file  . Number of children: Not on file  . Years of education: Not on file  . Highest education level: Not on file  Occupational History  . Not on file  Social Needs  . Financial resource strain: Not on file  . Food insecurity:    Worry: Not on file    Inability: Not on file  . Transportation needs:    Medical: Not on file    Non-medical: Not on file  Tobacco Use  . Smoking status: Never Smoker  . Smokeless tobacco: Current  User  Substance and Sexual Activity  . Alcohol use: No    Frequency: Never  . Drug use: No  . Sexual activity: Never  Lifestyle  . Physical activity:    Days per week: Not on file    Minutes per session: Not on file  . Stress: Not on file  Relationships  . Social connections:    Talks on phone: Not on file    Gets together: Not on file    Attends religious service: Not on file    Active member of club or organization: Not on file    Attends meetings of clubs or organizations: Not on file    Relationship status: Not on file  . Intimate partner violence:    Fear of current or ex partner: Not on file    Emotionally abused: Not on file    Physically abused: Not on file    Forced sexual activity: Not on file  Other Topics Concern  . Not on file  Social History Narrative  . Not on file     PHYSICAL EXAM   Vitals:   08/28/18 1317  BP: 129/69  Pulse: 63  Weight: 154 lb 3.2 oz (69.9 kg)    Not recorded      Body mass index is 26.47 kg/m.  PHYSICAL EXAMNIATION:  Gen: NAD, conversant, well nourised, obese, well groomed                     Cardiovascular: Regular rate rhythm, no peripheral edema, warm, nontender. Eyes: Conjunctivae clear without exudates or hemorrhage Neck: Supple, no carotid bruits. Pulmonary: Clear to auscultation bilaterally   NEUROLOGICAL EXAM: MMSE - Mini Mental State Exam 09/01/2018  Orientation to time 4  Orientation to Place 5  Registration 3  Attention/ Calculation 5  Recall 1  Language- name 2 objects 2  Language- repeat 1  Language- follow 3 step command 3  Language- read & follow direction 1  Write a sentence 0  Copy design 0  Total score 25     CRANIAL NERVES: CN II: Visual fields are full to confrontation. Fundoscopic exam is normal with sharp discs and no vascular changes. Pupils are round equal and briskly reactive to light. CN III, IV, VI: extraocular movement are normal. No ptosis. CN V: Facial sensation is intact to  pinprick in all 3 divisions bilaterally. Corneal responses are intact.  CN VII: Face is symmetric with normal eye closure and smile. CN VIII: Hearing is normal to rubbing fingers CN IX, X: Palate elevates symmetrically. Phonation is normal. CN XI: Head turning and shoulder shrug are intact CN XII: Tongue is midline with normal movements and no atrophy.  MOTOR: There is no pronator drift of out-stretched arms. Muscle bulk and tone are normal. Muscle strength is  normal.  REFLEXES: Reflexes are hypoactive and symmetric . Plantar responses are flexor.  SENSORY: Intact to light touch, pinprick, vibratory sensation are intact in fingers and toes.  COORDINATION: Rapid alternating movements and fine finger movements are intact. There is no dysmetria on finger-to-nose and heel-knee-shin.    GAIT/STANCE: Wheelchair, need assistance to get up, cautious,   DIAGNOSTIC DATA (LABS, IMAGING, TESTING) - I reviewed patient records, labs, notes, testing and imaging myself where available.   ASSESSMENT AND PLAN  Judith Turner is a 84 y.o. female   Worsening Confusion  In the setting of dementia,  MRI of the brain showed generalized atrophy with significant supratentorium small vessel disease, likely a combination of central nervous system degenerative disorder with a vascular component.  Elderly people with dementia tends to have worsening confusion in the setting of active infection,  She is now back to her baseline,  May consider add on Aricept 10 mg, Namenda 10 mg twice a day if she can tolerate  Continue to optimize vascular risk factor control.  Seizure  Has not had recurrent seizure for many years, neither patient nor the staff could provide detailed history about seizure,  Keep current dose of Keppra 500 mg twice a day  Levert Feinstein, M.D. Ph.D.  [facility] [address] [place], [place] [address] Ph: [phone] Fax: [phone]  CC:  Sherron Monday, MD

## 2019-01-23 IMAGING — MR MR HEAD W/O CM
9 of 11 series · 34 of 48 positions shown · non-contrast
Comparison: Head CT without contrast 0646 hours today. Head face
and cervical spine CT 12/01/2017.

CLINICAL DATA: [AGE] female code stroke, altered mental
status, slurred speech, left facial droop.

EXAM:
MRI HEAD WITHOUT CONTRAST
TECHNIQUE: Multiplanar, multiecho pulse sequences of the brain and surrounding
structures were obtained without intravenous contrast.

[Series 3: DWI · axial · 3.0mm · 0.94mm/px · z∈[-46,+110]mm · 9 of 106 slices shown (1 of 2)]
[im 1/106]
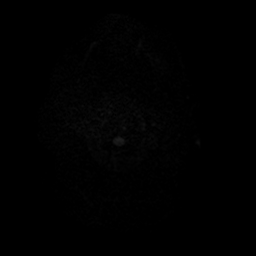
[im 14/106]
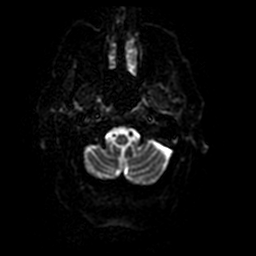
[im 27/106]
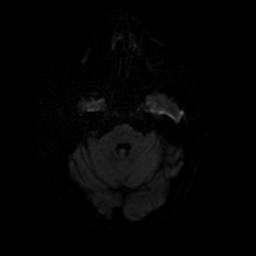
[im 40/106]
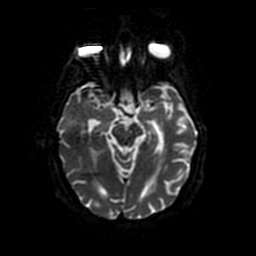
[im 53/106]
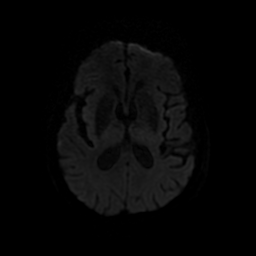
[im 66/106]
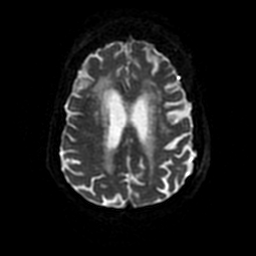
[im 79/106]
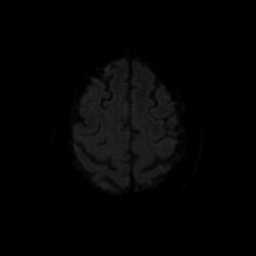
[im 92/106]
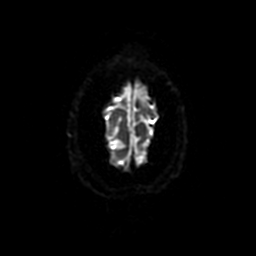
[im 106/106]
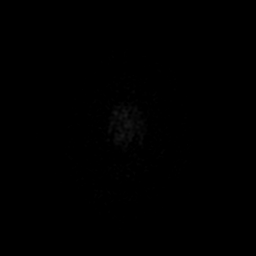

[Series 4: DWI · coronal · 4.0mm · 0.94mm/px · 6 of 76 slices shown (2 of 2)]
[im 1/76]
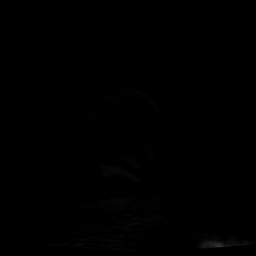
[im 16/76]
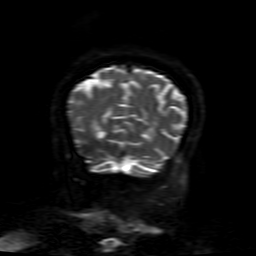
[im 31/76]
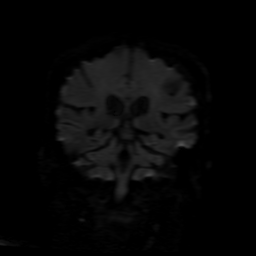
[im 46/76]
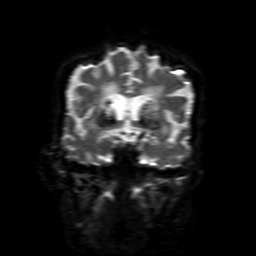
[im 61/76]
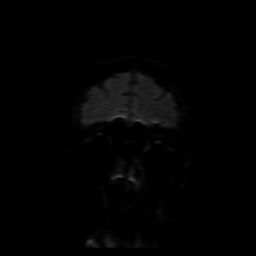
[im 76/76]
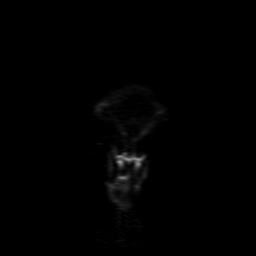

[Series 5: FLAIR · sagittal · 5.0mm · 0.47mm/px · 2 of 26 slices shown (1 of 2)]
[im 1/26]
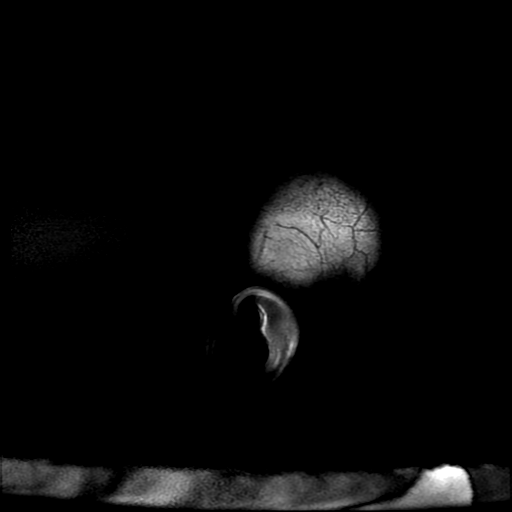
[im 26/26]
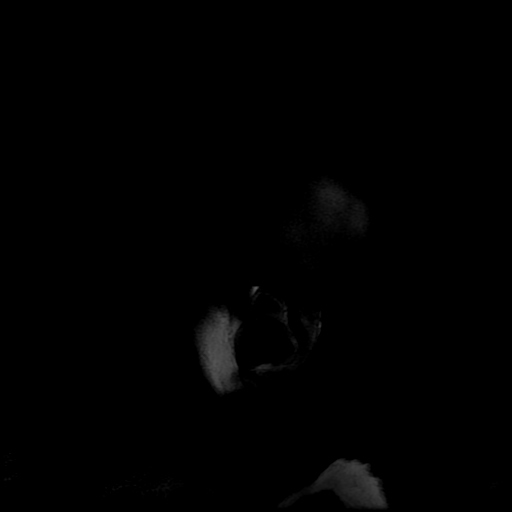

[Series 7: T2 · axial · 5.0mm · 0.47mm/px · z∈[-51,+116]mm · 2 of 29 slices shown (1 of 2)]
[im 1/29]
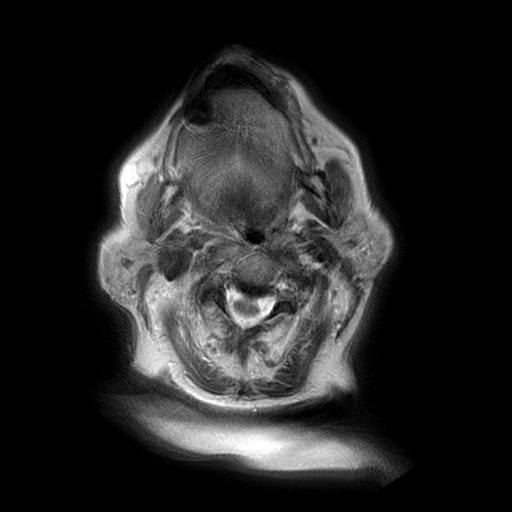
[im 29/29]
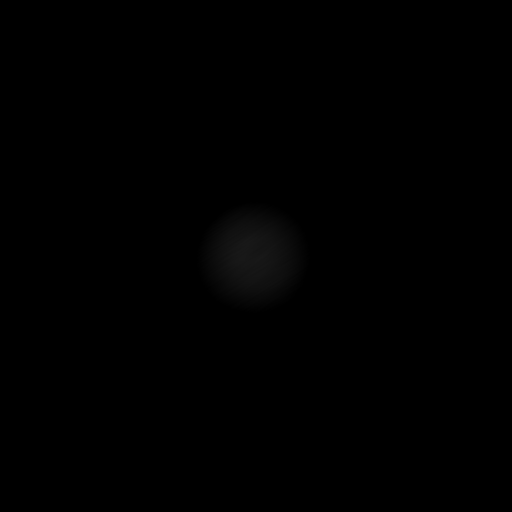

[Series 8: FLAIR · axial · 3.0mm · 0.45mm/px · z∈[-30,+119]mm · 2 of 26 slices shown (2 of 2)]
[im 1/26]
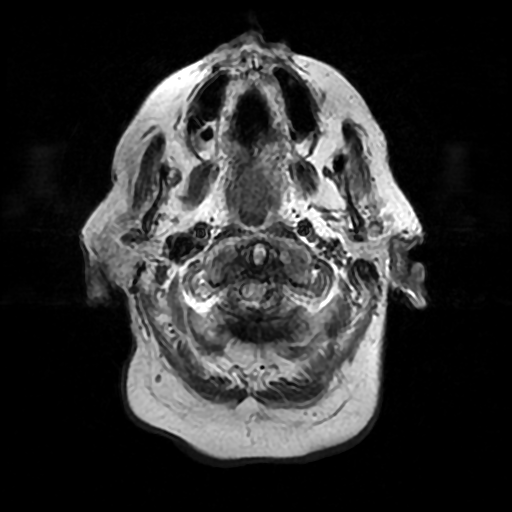
[im 26/26]
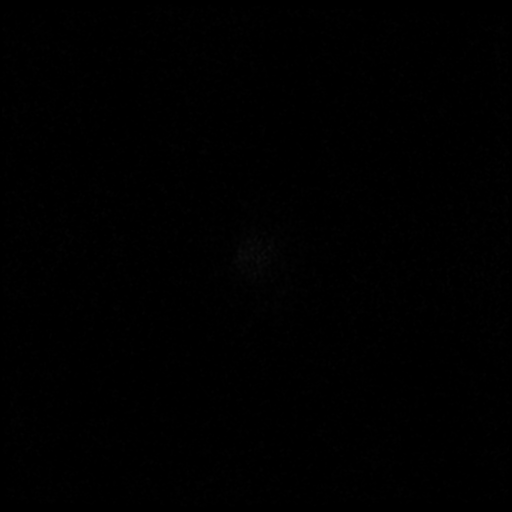

[Series 9: (person_name) · axial · 3.0mm · 0.47mm/px · z∈[-23,+50]mm · 4 of 100 slices shown]
[im 1/100]
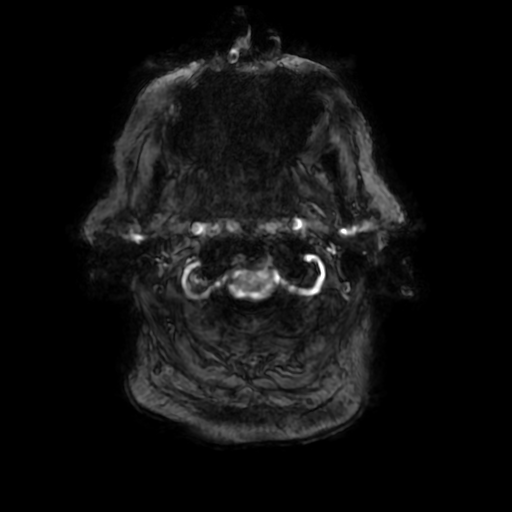
[im 17/100]
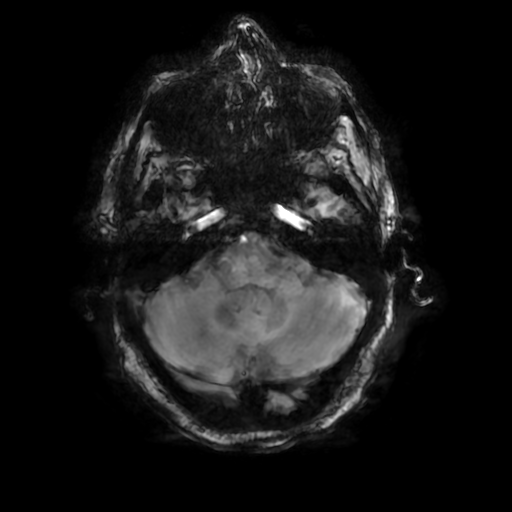
[im 34/100]
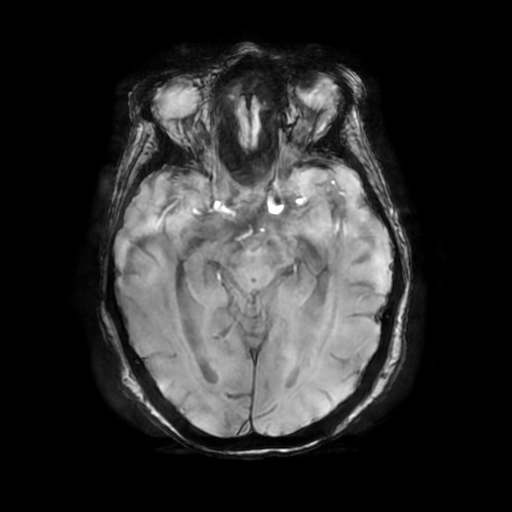
[im 50/100]
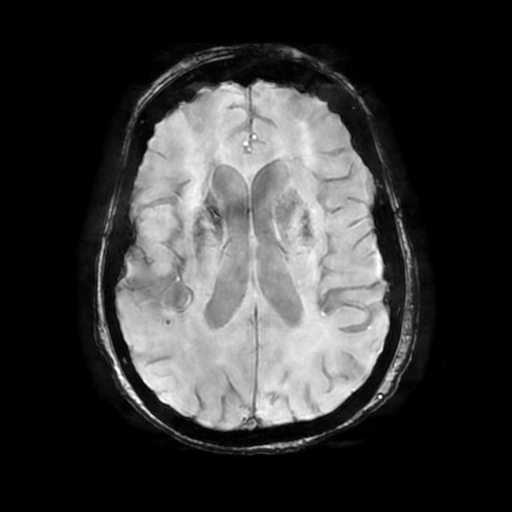

[Series 11: T2 · coronal · 5.0mm · 0.39mm/px · 2 of 31 slices shown (2 of 2)]
[im 1/31]
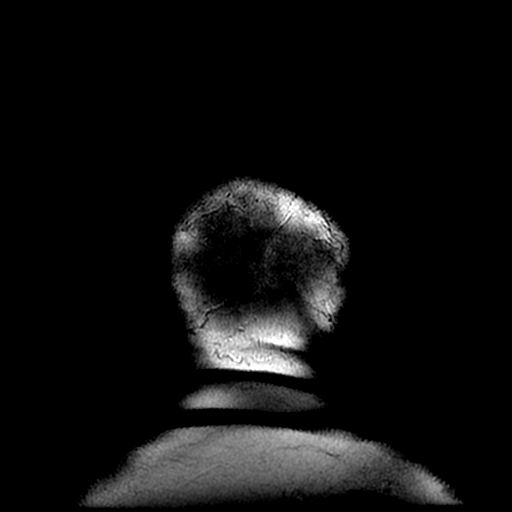
[im 31/31]
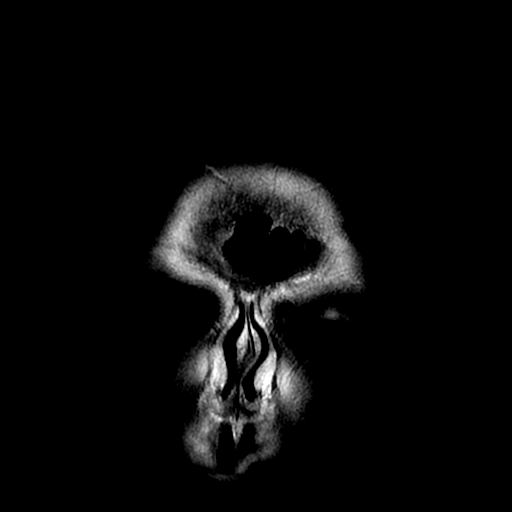

[Series 350: ADC · axial · 3.0mm · 0.94mm/px · z∈[-46,+110]mm · 4 of 53 slices shown (1 of 2)]
[im 1/53]
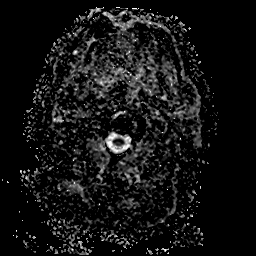
[im 18/53]
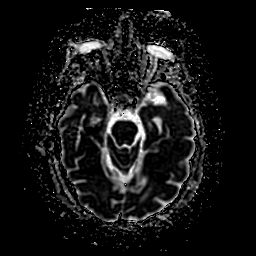
[im 35/53]
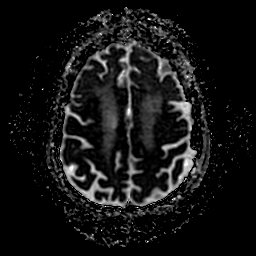
[im 53/53]
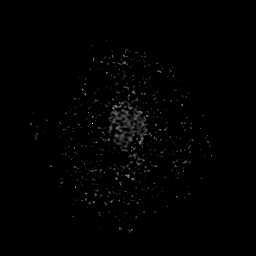

[Series 450: ADC · coronal · 4.0mm · 0.94mm/px · 3 of 38 slices shown (2 of 2)]
[im 1/38]
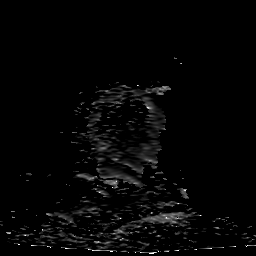
[im 19/38]
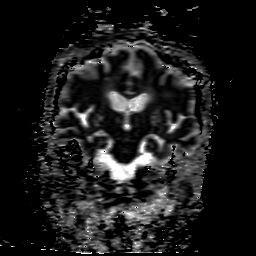
[im 38/38]
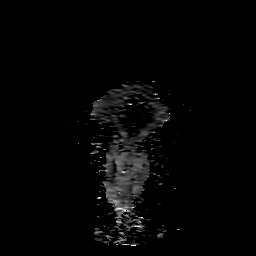

[34 of 48 positions shown; findings below may reference images not displayed]

FINDINGS: Brain: No restricted diffusion or evidence of acute infarction.
Extensive T2 heterogeneity in the bilateral deep gray matter nuclei
appears to reflect a combination of dilated perivascular spaces and
chronic lacunes (right caudate and are thalamus). There is a small
chronic lacune in the left paracentral pons. There is a tiny chronic
infarct in the right cerebellum (series 7, image 9). Patchy and
confluent bilateral cerebral white matter T2 and FLAIR
hyperintensity. Occasional chronic micro hemorrhages in the brain,
including the left cerebellum on series 9, image 19.

No midline shift, mass effect, evidence of mass lesion,
ventriculomegaly, extra-axial collection or acute intracranial
hemorrhage. Cervicomedullary junction and pituitary are within
normal limits.

Vascular: Major intracranial vascular flow voids are preserved.

Skull and upper cervical spine: Widespread cervical spine
degeneration with mild reversal of lordosis. Hyperostosis of the
calvarium. Visible bone marrow signal remains within normal limits.

Sinuses/Orbits: Postoperative changes to both globes. Otherwise
normal orbits soft tissues.

Paranasal sinuses and mastoids are stable and well pneumatized.

Other: Grossly normal visible internal auditory structures. Scalp
and face soft tissues appear negative.
IMPRESSION: 1.  No acute intracranial abnormality.
2. Moderate to advanced signal changes in the brain compatible with
chronic small vessel disease.

## 2019-02-14 IMAGING — DX DG CHEST 1V PORT
1 series · 1 of 1 positions shown · non-contrast
Comparison: June 26, 2018

CLINICAL DATA: Altered mental status

EXAM:
PORTABLE CHEST 1 VIEW

[chest]
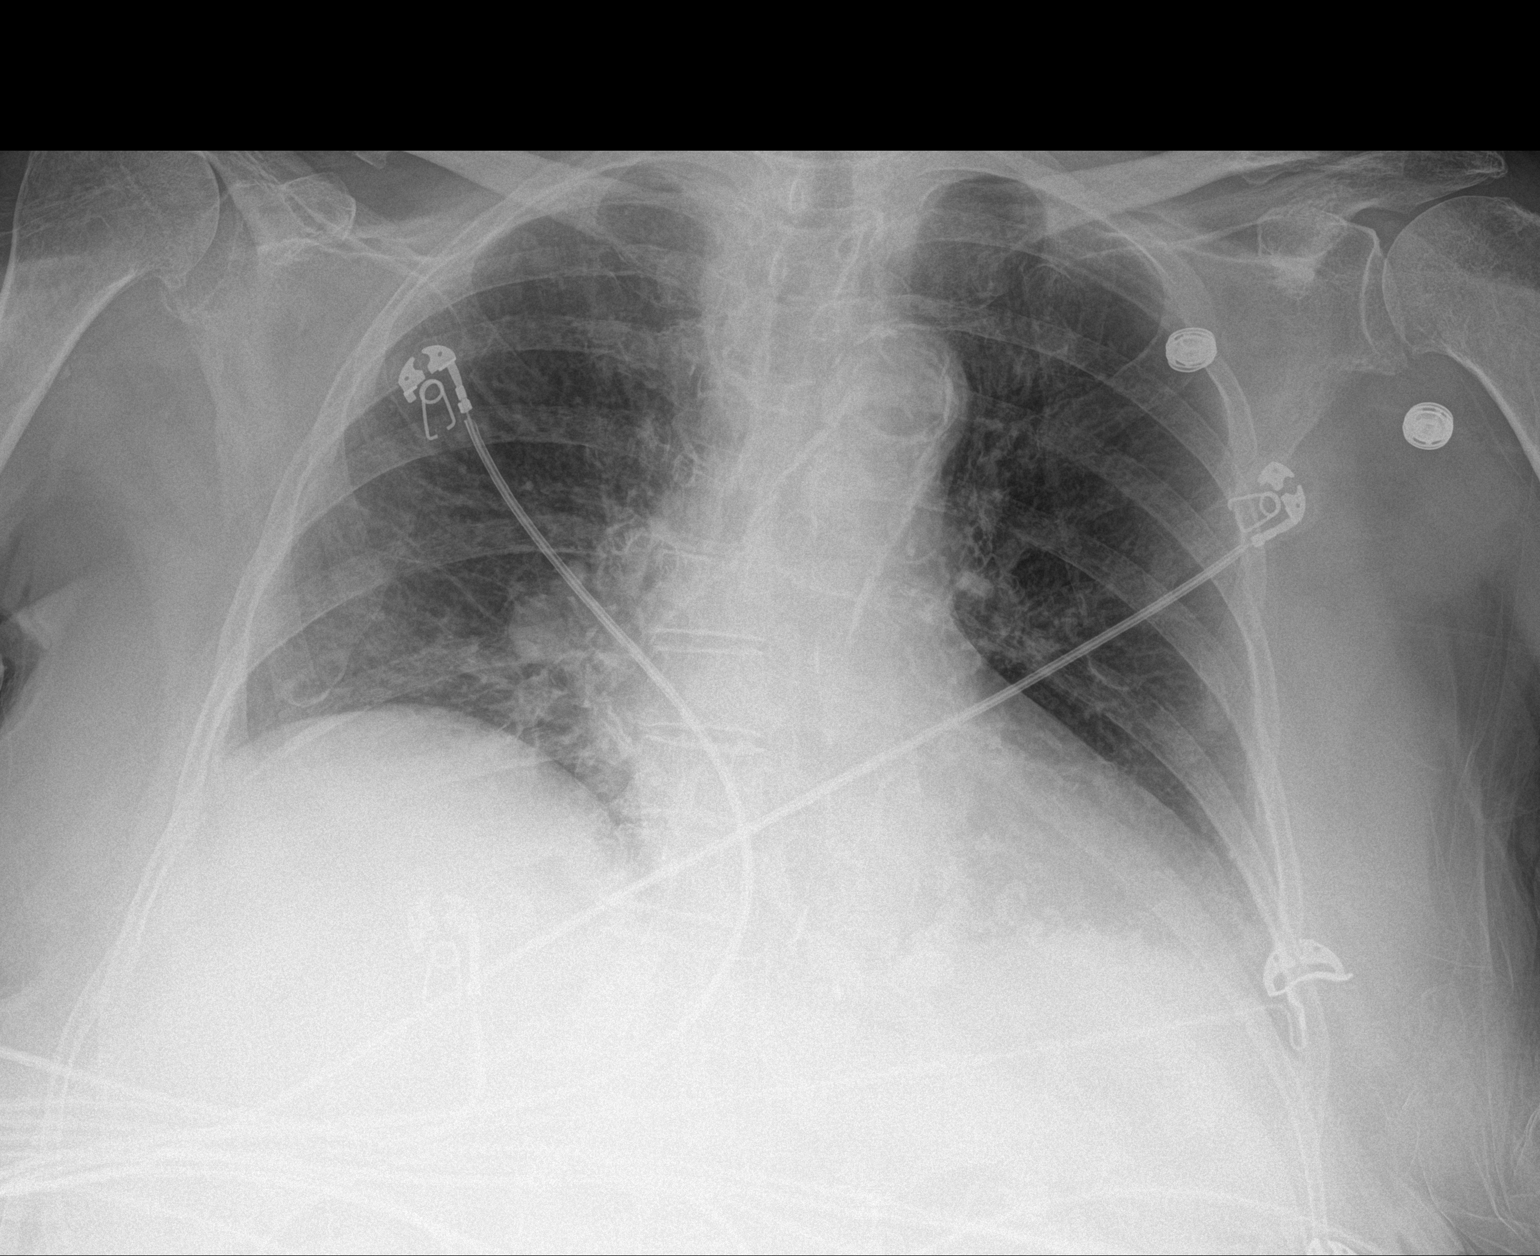

[1 of 1 positions shown; findings below may reference images not displayed]

FINDINGS: There is no edema or consolidation. Heart is upper normal in size
with pulmonary vascularity normal. There is aortic atherosclerosis.
No evident adenopathy. No bone lesions.
IMPRESSION: Aortic atherosclerosis. No edema or consolidation. Stable cardiac
silhouette.

Aortic Atherosclerosis ([id number]).

## 2021-05-15 DEATH — deceased
